# Patient Record
Sex: Female | Born: 1970 | Race: Black or African American | Hispanic: No | Marital: Married | Smoking: Never smoker
Health system: Southern US, Community
[De-identification: ages and names within clinical notes are randomized; demographics above are authoritative.]

## PROBLEM LIST (undated history)

## (undated) DIAGNOSIS — M25572 Pain in left ankle and joints of left foot: Secondary | ICD-10-CM

## (undated) DIAGNOSIS — K56609 Unspecified intestinal obstruction, unspecified as to partial versus complete obstruction: Secondary | ICD-10-CM

## (undated) DIAGNOSIS — K565 Intestinal adhesions [bands], unspecified as to partial versus complete obstruction: Secondary | ICD-10-CM

## (undated) DIAGNOSIS — G8929 Other chronic pain: Secondary | ICD-10-CM

## (undated) HISTORY — PX: ABDOMINAL HYSTERECTOMY: SHX81

## (undated) HISTORY — PX: URETHRA SURGERY: SHX824

## (undated) HISTORY — PX: HERNIA REPAIR: SHX51

---

## 2005-01-25 ENCOUNTER — Emergency Department (HOSPITAL_COMMUNITY): Admission: EM | Admit: 2005-01-25 | Discharge: 2005-01-26 | Payer: Self-pay | Admitting: Emergency Medicine

## 2006-02-06 ENCOUNTER — Other Ambulatory Visit: Admission: RE | Admit: 2006-02-06 | Discharge: 2006-02-06 | Payer: Self-pay | Admitting: Gynecology

## 2007-01-21 ENCOUNTER — Other Ambulatory Visit: Admission: RE | Admit: 2007-01-21 | Discharge: 2007-01-21 | Payer: Self-pay | Admitting: Family Medicine

## 2008-03-09 ENCOUNTER — Other Ambulatory Visit: Admission: RE | Admit: 2008-03-09 | Discharge: 2008-03-09 | Payer: Self-pay | Admitting: Family Medicine

## 2008-07-04 ENCOUNTER — Emergency Department (HOSPITAL_BASED_OUTPATIENT_CLINIC_OR_DEPARTMENT_OTHER): Admission: EM | Admit: 2008-07-04 | Discharge: 2008-07-05 | Payer: Self-pay | Admitting: Emergency Medicine

## 2008-08-24 ENCOUNTER — Ambulatory Visit (HOSPITAL_BASED_OUTPATIENT_CLINIC_OR_DEPARTMENT_OTHER): Admission: RE | Admit: 2008-08-24 | Discharge: 2008-08-24 | Payer: Self-pay | Admitting: Internal Medicine

## 2008-08-24 ENCOUNTER — Ambulatory Visit: Payer: Self-pay | Admitting: Diagnostic Radiology

## 2009-04-26 ENCOUNTER — Other Ambulatory Visit: Admission: RE | Admit: 2009-04-26 | Discharge: 2009-04-26 | Payer: Self-pay | Admitting: Family Medicine

## 2010-05-23 ENCOUNTER — Other Ambulatory Visit: Admission: RE | Admit: 2010-05-23 | Discharge: 2010-05-23 | Payer: Self-pay | Admitting: Family Medicine

## 2010-08-24 ENCOUNTER — Ambulatory Visit (HOSPITAL_COMMUNITY)
Admission: RE | Admit: 2010-08-24 | Discharge: 2010-08-26 | Payer: Self-pay | Source: Home / Self Care | Attending: Obstetrics and Gynecology | Admitting: Obstetrics and Gynecology

## 2010-08-24 ENCOUNTER — Encounter (INDEPENDENT_AMBULATORY_CARE_PROVIDER_SITE_OTHER): Payer: Self-pay | Admitting: Obstetrics and Gynecology

## 2010-08-31 ENCOUNTER — Encounter
Admission: RE | Admit: 2010-08-31 | Discharge: 2010-08-31 | Payer: Self-pay | Source: Home / Self Care | Attending: Obstetrics and Gynecology | Admitting: Obstetrics and Gynecology

## 2010-10-01 ENCOUNTER — Encounter: Payer: Self-pay | Admitting: Obstetrics and Gynecology

## 2010-11-20 LAB — BASIC METABOLIC PANEL
BUN: 13 mg/dL (ref 6–23)
CO2: 28 mEq/L (ref 19–32)
Chloride: 104 mEq/L (ref 96–112)
Creatinine, Ser: 1.03 mg/dL (ref 0.4–1.2)
Glucose, Bld: 100 mg/dL — ABNORMAL HIGH (ref 70–99)

## 2010-11-20 LAB — CBC
HCT: 30.9 % — ABNORMAL LOW (ref 36.0–46.0)
MCH: 29.3 pg (ref 26.0–34.0)
MCHC: 33 g/dL (ref 30.0–36.0)
MCV: 88.8 fL (ref 78.0–100.0)
RDW: 13.1 % (ref 11.5–15.5)

## 2010-11-21 LAB — CBC
HCT: 36.4 % (ref 36.0–46.0)
Hemoglobin: 12.4 g/dL (ref 12.0–15.0)
MCHC: 33.9 g/dL (ref 30.0–36.0)
MCV: 92.1 fL (ref 78.0–100.0)
RDW: 13.4 % (ref 11.5–15.5)

## 2011-06-12 LAB — CBC
Hemoglobin: 12.5
MCHC: 34.3
MCV: 90.7
RDW: 12.4

## 2011-06-12 LAB — COMPREHENSIVE METABOLIC PANEL
AST: 26
Albumin: 4.5
Alkaline Phosphatase: 45
BUN: 14
CO2: 23
Chloride: 103
GFR calc non Af Amer: 56 — ABNORMAL LOW
Potassium: 4.3
Total Bilirubin: 0.6

## 2011-06-12 LAB — DIFFERENTIAL
Basophils Absolute: 0.1
Basophils Relative: 1
Eosinophils Relative: 1
Monocytes Absolute: 0.7
Neutro Abs: 4.9

## 2011-06-12 LAB — URINALYSIS, ROUTINE W REFLEX MICROSCOPIC
Glucose, UA: NEGATIVE
Hgb urine dipstick: NEGATIVE
Ketones, ur: NEGATIVE
Protein, ur: NEGATIVE

## 2011-06-12 LAB — POCT CARDIAC MARKERS
CKMB, poc: <1 — ABNORMAL LOW
Myoglobin, poc: 36.8
Troponin i, poc: <0.05

## 2015-03-25 ENCOUNTER — Encounter (HOSPITAL_BASED_OUTPATIENT_CLINIC_OR_DEPARTMENT_OTHER): Payer: Self-pay | Admitting: *Deleted

## 2015-03-25 ENCOUNTER — Emergency Department (HOSPITAL_BASED_OUTPATIENT_CLINIC_OR_DEPARTMENT_OTHER): Payer: Managed Care, Other (non HMO)

## 2015-03-25 ENCOUNTER — Inpatient Hospital Stay (HOSPITAL_BASED_OUTPATIENT_CLINIC_OR_DEPARTMENT_OTHER)
Admission: EM | Admit: 2015-03-25 | Discharge: 2015-03-29 | DRG: 390 | Disposition: A | Discharge status: Home / Self Care | Payer: Managed Care, Other (non HMO) | Attending: General Surgery | Admitting: General Surgery

## 2015-03-25 DIAGNOSIS — K56609 Unspecified intestinal obstruction, unspecified as to partial versus complete obstruction: Secondary | ICD-10-CM

## 2015-03-25 DIAGNOSIS — Z886 Allergy status to analgesic agent status: Secondary | ICD-10-CM

## 2015-03-25 DIAGNOSIS — Z90710 Acquired absence of both cervix and uterus: Secondary | ICD-10-CM

## 2015-03-25 DIAGNOSIS — K5660 Unspecified intestinal obstruction: Secondary | ICD-10-CM | POA: Diagnosis not present

## 2015-03-25 DIAGNOSIS — R109 Unspecified abdominal pain: Secondary | ICD-10-CM | POA: Diagnosis not present

## 2015-03-25 HISTORY — DX: Unspecified intestinal obstruction, unspecified as to partial versus complete obstruction: K56.609

## 2015-03-25 LAB — URINALYSIS, ROUTINE W REFLEX MICROSCOPIC
Bilirubin Urine: NEGATIVE
Glucose, UA: NEGATIVE mg/dL
HGB URINE DIPSTICK: NEGATIVE
KETONES UR: 40 mg/dL — AB
LEUKOCYTES UA: NEGATIVE
Nitrite: NEGATIVE
PH: 6 (ref 5.0–8.0)
PROTEIN: NEGATIVE mg/dL
SPECIFIC GRAVITY, URINE: 1.026 (ref 1.005–1.030)
Urobilinogen, UA: 0.2 mg/dL (ref 0.0–1.0)

## 2015-03-25 LAB — COMPREHENSIVE METABOLIC PANEL
ALT: 13 U/L — ABNORMAL LOW (ref 14–54)
ANION GAP: 9 (ref 5–15)
AST: 20 U/L (ref 15–41)
Albumin: 4.4 g/dL (ref 3.5–5.0)
Alkaline Phosphatase: 55 U/L (ref 38–126)
BUN: 18 mg/dL (ref 6–20)
CALCIUM: 9.5 mg/dL (ref 8.9–10.3)
CO2: 26 mmol/L (ref 22–32)
CREATININE: 1.11 mg/dL — AB (ref 0.44–1.00)
Chloride: 102 mmol/L (ref 101–111)
GFR, EST NON AFRICAN AMERICAN: 60 mL/min — AB (ref >60)
GLUCOSE: 114 mg/dL — AB (ref 65–99)
Potassium: 3.9 mmol/L (ref 3.5–5.1)
SODIUM: 137 mmol/L (ref 135–145)
Total Bilirubin: 0.6 mg/dL (ref 0.3–1.2)
Total Protein: 8.4 g/dL — ABNORMAL HIGH (ref 6.5–8.1)

## 2015-03-25 LAB — CBC WITH DIFFERENTIAL/PLATELET
BASOS ABS: 0 10*3/uL (ref 0.0–0.1)
Basophils Relative: 0 % (ref 0–1)
EOS PCT: 0 % (ref 0–5)
Eosinophils Absolute: 0 10*3/uL (ref 0.0–0.7)
HEMATOCRIT: 39.8 % (ref 36.0–46.0)
HEMOGLOBIN: 13.4 g/dL (ref 12.0–15.0)
LYMPHS ABS: 2 10*3/uL (ref 0.7–4.0)
Lymphocytes Relative: 22 % (ref 12–46)
MCH: 29.8 pg (ref 26.0–34.0)
MCHC: 33.7 g/dL (ref 30.0–36.0)
MCV: 88.6 fL (ref 78.0–100.0)
MONOS PCT: 4 % (ref 3–12)
Monocytes Absolute: 0.3 10*3/uL (ref 0.1–1.0)
NEUTROS ABS: 6.8 10*3/uL (ref 1.7–7.7)
NEUTROS PCT: 74 % (ref 43–77)
PLATELETS: 332 10*3/uL (ref 150–400)
RBC: 4.49 MIL/uL (ref 3.87–5.11)
RDW: 12.4 % (ref 11.5–15.5)
WBC: 9.1 10*3/uL (ref 4.0–10.5)

## 2015-03-25 LAB — LIPASE, BLOOD: Lipase: 8 U/L — ABNORMAL LOW (ref 22–51)

## 2015-03-25 MED ORDER — ONDANSETRON HCL 4 MG/2ML IJ SOLN
4.0000 mg | Freq: Once | INTRAMUSCULAR | Status: AC
  Administered 2015-03-25: 4 mg via INTRAVENOUS
  Filled 2015-03-25: qty 2

## 2015-03-25 MED ORDER — MORPHINE SULFATE 4 MG/ML IJ SOLN
4.0000 mg | Freq: Once | INTRAMUSCULAR | Status: AC
  Administered 2015-03-25: 4 mg via INTRAVENOUS
  Filled 2015-03-25: qty 1

## 2015-03-25 MED ORDER — MORPHINE SULFATE 4 MG/ML IJ SOLN
4.0000 mg | Freq: Once | INTRAMUSCULAR | Status: AC
  Administered 2015-03-26: 4 mg via INTRAVENOUS
  Filled 2015-03-25: qty 1

## 2015-03-25 MED ORDER — IOHEXOL 300 MG/ML  SOLN
25.0000 mL | Freq: Once | INTRAMUSCULAR | Status: AC | PRN
  Administered 2015-03-25: 25 mL via ORAL

## 2015-03-25 MED ORDER — SODIUM CHLORIDE 0.9 % IV BOLUS (SEPSIS)
1000.0000 mL | Freq: Once | INTRAVENOUS | Status: AC
  Administered 2015-03-25: 1000 mL via INTRAVENOUS

## 2015-03-25 MED ORDER — IOHEXOL 300 MG/ML  SOLN
100.0000 mL | Freq: Once | INTRAMUSCULAR | Status: AC | PRN
  Administered 2015-03-25: 100 mL via INTRAVENOUS

## 2015-03-25 NOTE — ED Notes (Signed)
Abdominal pain. Nausea and vomiting since MN. Pelvic pain.

## 2015-03-25 NOTE — ED Provider Notes (Signed)
CSN: [id number]     Arrival date & time 03/25/15  1854 History  This chart was scribed for Geoffery Lyonsouglas Brindy Higginbotham, MD by Ronney LionSuzanne Le, ED Scribe. This patient was seen in room MH12/MH12 and the patient's care was started at 7:20 PM.    Chief Complaint  Patient presents with  . Abdominal Pain   Patient is a 44 y.o. female presenting with abdominal pain. The history is provided by the patient. No language interpreter was used.  Abdominal Pain Pain location:  Suprapubic Pain radiates to:  Does not radiate Pain severity:  Severe Duration:  20 hours Timing:  Constant Chronicity:  Recurrent Context: previous surgery (abdominal hysterectomy in 2011, followed by 3 surgeries in 2012 (see HPI comments))   Relieved by:  Nothing Worsened by:  Nothing tried Ineffective treatments:  None tried Associated symptoms: nausea and vomiting   Associated symptoms: no diarrhea and no dysuria    HPI Comments: Renee Murphy is a 44 y.o. female who presents to the Emergency Department complaining of severe suprapubic abdominal pain that began last night and woke her from sleep in the middle of the night. She states she then began to vomit and gag this morning when she woke up and has been unable to keep down food or drink since. Nobody at home has similar symptoms. Patient had similar symptoms 08/2014, during which she was [place] in [place] she states she was given an IV there and told to f/u with a PCP; she did not have surgery. Her PCP then diagnosed her with an incisional hernia in 09/2014. Patient has a history of abdominal hyste[facility] in 2011, and 3 surgeries to repair scar tissue related issues following that surgery in 2012, including a urethral re-implant surgery. She denies a history of diverticulitis. She also denies diarrhea or dysuria.   Past Medical History  Diagnosis Date  . Bowel obstruction    Past Surgical History  Procedure Laterality Date   . Abdominal hysterectomy    . Hernia repair    . Urethra surgery     No family history on file. History  Substance Use Topics  . Smoking status: Never Smoker   . Smokeless tobacco: Not on file  . Alcohol Use: No   OB History    No data available     Review of Systems  Gastrointestinal: Positive for nausea, vomiting and abdominal pain. Negative for diarrhea.  Genitourinary: Negative for dysuria.  All other systems reviewed and are negative.  Allergies  Review of patient's allergies indicates no known allergies.  Home Medications   Prior to Admission medications   Not on File   BP 134/78 mmHg  Pulse 78  Temp(Src) 98.5 F (36.9 C) (Oral)  Resp 18  Ht 5\' 9"  (1.753 m)  Wt 175 lb (79.379 kg)  BMI 25.83 kg/m2  SpO2 100% Physical Exam  Constitutional: She is oriented to person, place, and time. She appears well-developed and well-nourished. No distress.  HENT:  Head: Normocephalic and atraumatic.  Right Ear: Hearing normal.  Left Ear: Hearing normal.  Nose: Nose normal.  Mouth/Throat: Oropharynx is clear and moist and mucous membranes are normal.  Eyes: Conjunctivae and EOM are normal. Pupils are equal, round, and reactive to light.  Neck: Normal range of motion. Neck supple.  Cardiovascular: Regular rhythm, S1 normal and S2 normal.  Exam reveals no gallop and no friction rub.   No murmur heard. Pulmonary/Chest: Effort normal and breath sounds normal. No  respiratory distress. She exhibits no tenderness.  Abdominal: Soft. Normal appearance and bowel sounds are normal. There is no hepatosplenomegaly. There is tenderness. There is no rebound, no guarding, no tenderness at McBurney's point and negative Murphy's sign. No hernia.  TTP in LLQ. No rebound or guarding.  Musculoskeletal: Normal range of motion.  Neurological: She is alert and oriented to person, place, and time. She has normal strength. No cranial nerve deficit or sensory deficit. Coordination normal. GCS eye  subscore is 4. GCS verbal subscore is 5. GCS motor subscore is 6.  Skin: Skin is warm, dry and intact. No rash noted. No cyanosis.  Psychiatric: She has a normal mood and affect. Her speech is normal and behavior is normal. Thought content normal.  Nursing note and vitals reviewed.   ED Course  Procedures (including critical care time)  DIAGNOSTIC STUDIES: Oxygen Saturation is 100% on RA, normal by my interpretation.    COORDINATION OF CARE: 7:26 PM - Discussed treatment plan with pt at bedside which includes CT abdomen to r/o bowel obstruction, blood tests, and medications, and pt agreed to plan.   Labs Review Labs Reviewed  COMPREHENSIVE METABOLIC PANEL - Abnormal; Notable for the following:    Glucose, Bld 114 (*)    Creatinine, Ser 1.11 (*)    Total Protein 8.4 (*)    ALT 13 (*)    GFR calc non Af Amer 60 (*)    All other components within normal limits  LIPASE, BLOOD - Abnormal; Notable for the following:    Lipase 8 (*)    All other components within normal limits  URINALYSIS, ROUTINE W REFLEX MICROSCOPIC (NOT AT [facility]) - Abnormal; Notable for the following:    Ketones, ur 40 (*)    All other components within normal limits  CBC WITH DIFFERENTIAL/PLATELET    Imaging Review No results found.   EKG Interpretation None      MDM   Final diagnoses:  None    Patient with history of multiple abdominal surgeries. She presents here with complaints of lower abdominal discomfort that started earlier this morning. She has a history of prior small bowel obstructions and this feels the same. Laboratory studies are unremarkable, however CT scan reveals what appears to be an early sbo.  I have spoken with Dr. Carolynne Edouard from general surgery who is recommending transfer to the [facility] ER where he will evaluate the patient.   I personally performed the services described in this documentation, which was scribed in my presence. The recorded information has been reviewed and is  accurate.     Geoffery Lyons, MD 03/25/15 2202

## 2015-03-25 NOTE — ED Provider Notes (Addendum)
[facility] Some more pain since last had meds, otherwise no new complaints.  Dr Carolynne Edouardoth aware [facility].   Raeford RazorStephen Torrey Ballinas, MD 03/25/15 [phone]  Raeford RazorStephen Milika Ventress, MD 03/25/15 [phone]

## 2015-03-25 NOTE — ED Notes (Signed)
Bed: WA08 Expected date:  Expected time:  Means of arrival:  Comments: [facility]

## 2015-03-26 DIAGNOSIS — Z90710 Acquired absence of both cervix and uterus: Secondary | ICD-10-CM | POA: Diagnosis not present

## 2015-03-26 DIAGNOSIS — R109 Unspecified abdominal pain: Secondary | ICD-10-CM | POA: Diagnosis present

## 2015-03-26 DIAGNOSIS — K56609 Unspecified intestinal obstruction, unspecified as to partial versus complete obstruction: Secondary | ICD-10-CM | POA: Diagnosis present

## 2015-03-26 DIAGNOSIS — Z886 Allergy status to analgesic agent status: Secondary | ICD-10-CM | POA: Diagnosis not present

## 2015-03-26 DIAGNOSIS — K5660 Unspecified intestinal obstruction: Secondary | ICD-10-CM | POA: Diagnosis present

## 2015-03-26 LAB — CBC
HCT: 34.2 % — ABNORMAL LOW (ref 36.0–46.0)
Hemoglobin: 11.1 g/dL — ABNORMAL LOW (ref 12.0–15.0)
MCH: 29.4 pg (ref 26.0–34.0)
MCHC: 32.5 g/dL (ref 30.0–36.0)
MCV: 90.7 fL (ref 78.0–100.0)
PLATELETS: 303 10*3/uL (ref 150–400)
RBC: 3.77 MIL/uL — ABNORMAL LOW (ref 3.87–5.11)
RDW: 13 % (ref 11.5–15.5)
WBC: 6.7 10*3/uL (ref 4.0–10.5)

## 2015-03-26 LAB — BASIC METABOLIC PANEL
ANION GAP: 4 — AB (ref 5–15)
BUN: 14 mg/dL (ref 6–20)
CALCIUM: 8.5 mg/dL — AB (ref 8.9–10.3)
CO2: 28 mmol/L (ref 22–32)
Chloride: 106 mmol/L (ref 101–111)
Creatinine, Ser: 1.14 mg/dL — ABNORMAL HIGH (ref 0.44–1.00)
GFR calc Af Amer: >60 mL/min (ref >60)
GFR calc non Af Amer: 58 mL/min — ABNORMAL LOW (ref >60)
Glucose, Bld: 133 mg/dL — ABNORMAL HIGH (ref 65–99)
Potassium: 3.7 mmol/L (ref 3.5–5.1)
Sodium: 138 mmol/L (ref 135–145)

## 2015-03-26 MED ORDER — CHLORHEXIDINE GLUCONATE 0.12 % MT SOLN
15.0000 mL | Freq: Two times a day (BID) | OROMUCOSAL | Status: DC
  Administered 2015-03-26 – 2015-03-28 (×5): 15 mL via OROMUCOSAL
  Filled 2015-03-26 (×9): qty 15

## 2015-03-26 MED ORDER — CETYLPYRIDINIUM CHLORIDE 0.05 % MT LIQD
7.0000 mL | Freq: Two times a day (BID) | OROMUCOSAL | Status: DC
  Administered 2015-03-26 (×2): 7 mL via OROMUCOSAL

## 2015-03-26 MED ORDER — ONDANSETRON HCL 4 MG/2ML IJ SOLN: 4.0000 mg | Freq: Four times a day (QID) | INTRAMUSCULAR | Status: DC | PRN

## 2015-03-26 MED ORDER — KCL IN DEXTROSE-NACL 20-5-0.9 MEQ/L-%-% IV SOLN
INTRAVENOUS | Status: DC
  Administered 2015-03-26: 100 mL via INTRAVENOUS
  Administered 2015-03-26 – 2015-03-28 (×6): via INTRAVENOUS
  Filled 2015-03-26 (×10): qty 1000

## 2015-03-26 MED ORDER — PANTOPRAZOLE SODIUM 40 MG IV SOLR
40.0000 mg | Freq: Every day | INTRAVENOUS | Status: DC
  Administered 2015-03-26 – 2015-03-29 (×4): 40 mg via INTRAVENOUS
  Filled 2015-03-26 (×5): qty 40

## 2015-03-26 MED ORDER — MORPHINE SULFATE 2 MG/ML IJ SOLN
1.0000 mg | INTRAMUSCULAR | Status: DC | PRN
  Administered 2015-03-26: 2 mg via INTRAVENOUS
  Filled 2015-03-26: qty 1

## 2015-03-26 NOTE — Progress Notes (Signed)
Subjective: Feeling better. Nausea has resolved. No flatus yet  Objective: Vital signs in last 24 hours: Temp:  [97.8 F (36.6 C)-98.5 F (36.9 C)] 98 F (36.7 C) (07/16 0530) Pulse Rate:  [62-78] 62 (07/16 0530) Resp:  [16-18] 16 (07/16 0530) BP: (116-143)/(63-94) 116/63 mmHg (07/16 0530) SpO2:  [97 %-100 %] 100 % (07/16 0530) Weight:  [79.1 kg (174 lb 6.1 oz)-79.379 kg (175 lb)] 79.1 kg (174 lb 6.1 oz) (07/16 0115) Last BM Date: 03/25/15  Intake/Output from previous day: 07/15 0701 - 07/16 0700 In: -  Out: 500 [Urine:500] Intake/Output this shift:    Resp: clear to auscultation bilaterally Cardio: regular rate and rhythm GI: soft, mild tenderness. good bs. no distension  Lab Results:   Recent Labs  03/25/15 1925 03/26/15 0532  WBC 9.1 6.7  HGB 13.4 11.1*  HCT 39.8 34.2*  PLT 332 303   BMET  Recent Labs  03/25/15 1925 03/26/15 0532  NA 137 138  K 3.9 3.7  CL 102 106  CO2 26 28  GLUCOSE 114* 133*  BUN 18 14  CREATININE 1.11* 1.14*  CALCIUM 9.5 8.5*   PT/INR No results for input(s): LABPROT, INR in the last 72 hours. ABG No results for input(s): PHART, HCO3 in the last 72 hours.  Invalid input(s): PCO2, PO2  Studies/Results: Ct Abdomen Pelvis W Contrast  03/25/2015   CLINICAL DATA:  Severe abdominal pain starting last night, vomiting, history of bowel obstruction  EXAM: CT ABDOMEN AND PELVIS WITH CONTRAST  TECHNIQUE: Multidetector CT imaging of the abdomen and pelvis was performed using the standard protocol following bolus administration of intravenous contrast.  CONTRAST:  25mL OMNIPAQUE IOHEXOL 300 MG/ML SOLN, 100mL OMNIPAQUE IOHEXOL 300 MG/ML SOLN  COMPARISON:  08/31/2010.  FINDINGS: Sagittal images of the spine are unremarkable.  The lung bases are unremarkable. Enhanced liver shows stable scattered hepatic cysts. No calcified gallstones are noted within gallbladder.  The pancreas, spleen and adrenal glands are unremarkable. Kidneys are  symmetrical in size and enhancement. Some lobulated contour of the left kidney again noted. No hydronephrosis or hydroureter.  Delayed renal images shows bilateral renal symmetrical excretion. Bilateral visualized proximal ureter is unremarkable. Stable bilateral mild dilatation of extrarenal pelvis.  Oral contrast material was given to the patient. No any contrast material is noted within distal small bowel or within colon.  There is some liquid stool in right colon and transverse colon. Post hernia repair changes noted mid anterior abdominal wall.  A normal appendix is noted in axial image 55.  In axial image 63 there is a distended small bowel loop in right lower pelvis anteriorly suprapubic region. Mild thickening of small bowel wall. There is transition point in caliber of small bowel at this level in axial image 62. Some pulling contrast material is noted at this level. Findings are highly suspicious for small bowel obstruction. Beyond this point the small is smaller caliber. This is confirmed in coronal image 36.  The patient is status post hysterectomy. Small amount of free fluid noted left posterior pelvis. Nonspecific mild thickening of urinary bladder bladder wall which is under distended.  IMPRESSION: 1. In axial image 63 there is a distended small bowel loop in right lower pelvis anteriorly suprapubic region. Mild thickening of small bowel wall. There is transition point in caliber of small bowel at this level in axial image 62. Some pulling contrast material is noted at this level. Findings are highly suspicious for small bowel obstruction. 2. No hydronephrosis or hydroureter. 3.  Normal appendix.  No pericecal inflammation. 4. Status post hysterectomy. 5. Small amount of free fluid noted in left posterior pelvis.  These results were called by telephone at the time of interpretation on 03/25/2015 at 9:26 pm to Dr. Geoffery Lyons , who verbally acknowledged these results.   Electronically Signed   By: Natasha Mead M.D.   On: 03/25/2015 21:27    Anti-infectives: Anti-infectives    None      Assessment/Plan: s/p * No surgery found * continue bowel rest for sbo  Recheck abd xrays tomorrow ambulate  LOS: 0 days    TOTH III,PAUL S 03/26/2015

## 2015-03-26 NOTE — Plan of Care (Signed)
Problem: Phase I Progression Outcomes Goal: Pain controlled with appropriate interventions Outcome: Progressing Good control of abd pain, but pt c/o persistent HA on forehead and L temple.  Noted pt keeping head sloped forward off of pillow and encouraged her to keep good head/neck support.  Pt using ice/heat and Dilaudid to bring HA from 7/10 to 4/10.

## 2015-03-26 NOTE — Progress Notes (Signed)
UR Completed. Chrishawn Kring, RN, BSN.  [phone] 

## 2015-03-26 NOTE — H&P (Signed)
Renee Murphy is an 44 y.o. female.   Chief Complaint: abdominal pain HPI: The pt is a 44 yo bf with a history of multiple abdominal surgeries and lots of scar tissue who presents with abdominal pain that started last night. Some nausea and vomiting. No fever. CT shows sbo  Past Medical History  Diagnosis Date  . Bowel obstruction     Past Surgical History  Procedure Laterality Date  . Abdominal hysterectomy    . Hernia repair    . Urethra surgery      No family history on file. Social History:  reports that she has never smoked. She does not have any smokeless tobacco history on file. She reports that she does not drink alcohol or use illicit drugs.  Allergies:  Allergies  Allergen Reactions  . Aspirin Nausea And Vomiting     (Not in a hospital admission)  Results for orders placed or performed during the hospital encounter of 03/25/15 (from the past 48 hour(s))  Comprehensive metabolic panel     Status: Abnormal   Collection Time: 03/25/15  7:25 PM  Result Value Ref Range   Sodium 137 135 - 145 mmol/L   Potassium 3.9 3.5 - 5.1 mmol/L   Chloride 102 101 - 111 mmol/L   CO2 26 22 - 32 mmol/L   Glucose, Bld 114 (H) 65 - 99 mg/dL   BUN 18 6 - 20 mg/dL   Creatinine, Ser 1.11 (H) 0.44 - 1.00 mg/dL   Calcium 9.5 8.9 - 10.3 mg/dL   Total Protein 8.4 (H) 6.5 - 8.1 g/dL   Albumin 4.4 3.5 - 5.0 g/dL   AST 20 15 - 41 U/L   ALT 13 (L) 14 - 54 U/L   Alkaline Phosphatase 55 38 - 126 U/L   Total Bilirubin 0.6 0.3 - 1.2 mg/dL   GFR calc non Af Amer 60 (L) >60 mL/min   GFR calc Af Amer >60 >60 mL/min    Comment: (NOTE) The eGFR has been calculated using the CKD EPI equation. This calculation has not been validated in all clinical situations. eGFR's persistently <60 mL/min signify possible Chronic Kidney Disease.    Anion gap 9 5 - 15  CBC with Differential     Status: None   Collection Time: 03/25/15  7:25 PM  Result Value Ref Range   WBC 9.1 4.0 - 10.5 K/uL   RBC 4.49  3.87 - 5.11 MIL/uL   Hemoglobin 13.4 12.0 - 15.0 g/dL   HCT 39.8 36.0 - 46.0 %   MCV 88.6 78.0 - 100.0 fL   MCH 29.8 26.0 - 34.0 pg   MCHC 33.7 30.0 - 36.0 g/dL   RDW 12.4 11.5 - 15.5 %   Platelets 332 150 - 400 K/uL   Neutrophils Relative % 74 43 - 77 %   Neutro Abs 6.8 1.7 - 7.7 K/uL   Lymphocytes Relative 22 12 - 46 %   Lymphs Abs 2.0 0.7 - 4.0 K/uL   Monocytes Relative 4 3 - 12 %   Monocytes Absolute 0.3 0.1 - 1.0 K/uL   Eosinophils Relative 0 0 - 5 %   Eosinophils Absolute 0.0 0.0 - 0.7 K/uL   Basophils Relative 0 0 - 1 %   Basophils Absolute 0.0 0.0 - 0.1 K/uL  Lipase, blood     Status: Abnormal   Collection Time: 03/25/15  7:25 PM  Result Value Ref Range   Lipase 8 (L) 22 - 51 U/L  Urinalysis, Routine w reflex  microscopic (not at [facility])     Status: Abnormal   Collection Time: 03/25/15  7:25 PM  Result Value Ref Range   Color, Urine YELLOW YELLOW   APPearance CLEAR CLEAR   Specific Gravity, Urine 1.026 1.005 - 1.030   pH 6.0 5.0 - 8.0   Glucose, UA NEGATIVE NEGATIVE mg/dL   Hgb urine dipstick NEGATIVE NEGATIVE   Bilirubin Urine NEGATIVE NEGATIVE   Ketones, ur 40 (A) NEGATIVE mg/dL   Protein, ur NEGATIVE NEGATIVE mg/dL   Urobilinogen, UA 0.2 0.0 - 1.0 mg/dL   Nitrite NEGATIVE NEGATIVE   Leukocytes, UA NEGATIVE NEGATIVE    Comment: MICROSCOPIC NOT DONE ON URINES WITH NEGATIVE PROTEIN, BLOOD, LEUKOCYTES, NITRITE, OR GLUCOSE <1000 mg/dL.   Ct Abdomen Pelvis W Contrast  03/25/2015   CLINICAL DATA:  Severe abdominal pain starting last night, vomiting, history of bowel obstruction  EXAM: CT ABDOMEN AND PELVIS WITH CONTRAST  TECHNIQUE: Multidetector CT imaging of the abdomen and pelvis was performed using the standard protocol following bolus administration of intravenous contrast.  CONTRAST:  77m OMNIPAQUE IOHEXOL 300 MG/ML SOLN, 1042mOMNIPAQUE IOHEXOL 300 MG/ML SOLN  COMPARISON:  08/31/2010.  FINDINGS: Sagittal images of the spine are unremarkable.  The lung bases are  unremarkable. Enhanced liver shows stable scattered hepatic cysts. No calcified gallstones are noted within gallbladder.  The pancreas, spleen and adrenal glands are unremarkable. Kidneys are symmetrical in size and enhancement. Some lobulated contour of the left kidney again noted. No hydronephrosis or hydroureter.  Delayed renal images shows bilateral renal symmetrical excretion. Bilateral visualized proximal ureter is unremarkable. Stable bilateral mild dilatation of extrarenal pelvis.  Oral contrast material was given to the patient. No any contrast material is noted within distal small bowel or within colon.  There is some liquid stool in right colon and transverse colon. Post hernia repair changes noted mid anterior abdominal wall.  A normal appendix is noted in axial image 55.  In axial image 63 there is a distended small bowel loop in right lower pelvis anteriorly suprapubic region. Mild thickening of small bowel wall. There is transition point in caliber of small bowel at this level in axial image 62. Some pulling contrast material is noted at this level. Findings are highly suspicious for small bowel obstruction. Beyond this point the small is smaller caliber. This is confirmed in coronal image 36.  The patient is status post hysterectomy. Small amount of free fluid noted left posterior pelvis. Nonspecific mild thickening of urinary bladder bladder wall which is under distended.  IMPRESSION: 1. In axial image 63 there is a distended small bowel loop in right lower pelvis anteriorly suprapubic region. Mild thickening of small bowel wall. There is transition point in caliber of small bowel at this level in axial image 62. Some pulling contrast material is noted at this level. Findings are highly suspicious for small bowel obstruction. 2. No hydronephrosis or hydroureter. 3. Normal appendix.  No pericecal inflammation. 4. Status post hysterectomy. 5. Small amount of free fluid noted in left posterior pelvis.   These results were called by telephone at the time of interpretation on 03/25/2015 at 9:26 pm to Dr. DOVeryl Speak who verbally acknowledged these results.   Electronically Signed   By: LiLahoma Crocker.D.   On: 03/25/2015 21:27    Review of Systems  Constitutional: Negative.   HENT: Negative.   Eyes: Negative.   Respiratory: Negative.   Cardiovascular: Negative.   Gastrointestinal: Positive for nausea, vomiting and abdominal pain.  Genitourinary: Negative.   Musculoskeletal: Negative.   Skin: Negative.   Neurological: Negative.   Endo/Heme/Allergies: Negative.   Psychiatric/Behavioral: Negative.     Blood pressure 143/94, pulse 67, temperature 98.5 F (36.9 C), temperature source Oral, resp. rate 18, height _0  (1.753 m), weight 79.379 kg (175 lb), SpO2 100 %. Physical Exam  Constitutional: She is oriented to person, place, and time. She appears well-developed and well-nourished.  HENT:  Head: Normocephalic and atraumatic.  Eyes: Conjunctivae and EOM are normal. Pupils are equal, round, and reactive to light.  Neck: Normal range of motion. Neck supple.  Cardiovascular: Normal rate, regular rhythm and normal heart sounds.   Respiratory: Effort normal and breath sounds normal.  GI: Soft. Bowel sounds are normal. She exhibits no distension.  There is mild tenderness over lower abdomen, worse on left. No guarding or peritonitis.  Musculoskeletal: Normal range of motion.  Neurological: She is alert and oriented to person, place, and time.  Skin: Skin is warm and dry.  Psychiatric: She has a normal mood and affect. Her behavior is normal.     Assessment/Plan The pt appears to have a sbo. I will plan to admit for bowel rest and close observation.  TOTH III,Zaiyden Strozier S 03/26/2015, 12:10 AM

## 2015-03-27 ENCOUNTER — Inpatient Hospital Stay (HOSPITAL_COMMUNITY): Payer: Managed Care, Other (non HMO)

## 2015-03-27 NOTE — Progress Notes (Signed)
Patient ID: Renee Murphy, female   DOB: 02-Mar-1971, 44 y.o.   MRN: [id number]    Subjective: Had some flatus yesterday but no bowel movements. No bowel activity today. Denies nausea or vomiting. Some intermittent mid abdominal pain which is unchanged.  Objective: Vital signs in last 24 hours: Temp:  [98 F (36.7 C)-99 F (37.2 C)] 98.4 F (36.9 C) (07/17 0629) Pulse Rate:  [68-74] 68 (07/17 0629) Resp:  [16-18] 18 (07/17 0629) BP: (96-141)/(56-87) 96/56 mmHg (07/17 0629) SpO2:  [100 %] 100 % (07/17 0629) Last BM Date: 03/25/15  Intake/Output from previous day: 07/16 0701 - 07/17 0700 In: 2848.3 [I.V.:2848.3] Out: 0  Intake/Output this shift:    General appearance: alert, cooperative and no distress GI: minimal if any distention. Soft with very mild diffuse tenderness, no guarding. She states she is less tender than yesterday.  Lab Results:   Recent Labs  03/25/15 1925 03/26/15 0532  WBC 9.1 6.7  HGB 13.4 11.1*  HCT 39.8 34.2*  PLT 332 303   BMET  Recent Labs  03/25/15 1925 03/26/15 0532  NA 137 138  K 3.9 3.7  CL 102 106  CO2 26 28  GLUCOSE 114* 133*  BUN 18 14  CREATININE 1.11* 1.14*  CALCIUM 9.5 8.5*     Studies/Results: Ct Abdomen Pelvis W Contrast  03/25/2015   CLINICAL DATA:  Severe abdominal pain starting last night, vomiting, history of bowel obstruction  EXAM: CT ABDOMEN AND PELVIS WITH CONTRAST  TECHNIQUE: Multidetector CT imaging of the abdomen and pelvis was performed using the standard protocol following bolus administration of intravenous contrast.  CONTRAST:  25mL OMNIPAQUE IOHEXOL 300 MG/ML SOLN, 100mL OMNIPAQUE IOHEXOL 300 MG/ML SOLN  COMPARISON:  08/31/2010.  FINDINGS: Sagittal images of the spine are unremarkable.  The lung bases are unremarkable. Enhanced liver shows stable scattered hepatic cysts. No calcified gallstones are noted within gallbladder.  The pancreas, spleen and adrenal glands are unremarkable. Kidneys are symmetrical in  size and enhancement. Some lobulated contour of the left kidney again noted. No hydronephrosis or hydroureter.  Delayed renal images shows bilateral renal symmetrical excretion. Bilateral visualized proximal ureter is unremarkable. Stable bilateral mild dilatation of extrarenal pelvis.  Oral contrast material was given to the patient. No any contrast material is noted within distal small bowel or within colon.  There is some liquid stool in right colon and transverse colon. Post hernia repair changes noted mid anterior abdominal wall.  A normal appendix is noted in axial image 55.  In axial image 63 there is a distended small bowel loop in right lower pelvis anteriorly suprapubic region. Mild thickening of small bowel wall. There is transition point in caliber of small bowel at this level in axial image 62. Some pulling contrast material is noted at this level. Findings are highly suspicious for small bowel obstruction. Beyond this point the small is smaller caliber. This is confirmed in coronal image 36.  The patient is status post hysterectomy. Small amount of free fluid noted left posterior pelvis. Nonspecific mild thickening of urinary bladder bladder wall which is under distended.  IMPRESSION: 1. In axial image 63 there is a distended small bowel loop in right lower pelvis anteriorly suprapubic region. Mild thickening of small bowel wall. There is transition point in caliber of small bowel at this level in axial image 62. Some pulling contrast material is noted at this level. Findings are highly suspicious for small bowel obstruction. 2. No hydronephrosis or hydroureter. 3. Normal appendix.  No  pericecal inflammation. 4. Status post hysterectomy. 5. Small amount of free fluid noted in left posterior pelvis.  These results were called by telephone at the time of interpretation on 03/25/2015 at 9:26 pm to Dr. Geoffery Lyons , who verbally acknowledged these results.   Electronically Signed   By: Natasha Mead M.D.    On: 03/25/2015 21:27    Anti-infectives: Anti-infectives    None      Assessment/Plan: Partial small bowel obstruction. History of multiple abdominal procedures, ventral hernia repair with mesh and has been told she has severe adhesions. Clinically seems about the same or slightly improved. X-rays this morning pending. Continue current management with bowel rest and IV fluids.    LOS: 1 day    Xyla Leisner T 03/27/2015

## 2015-03-28 ENCOUNTER — Inpatient Hospital Stay (HOSPITAL_COMMUNITY): Payer: Managed Care, Other (non HMO)

## 2015-03-28 LAB — BASIC METABOLIC PANEL
ANION GAP: 5 (ref 5–15)
BUN: 7 mg/dL (ref 6–20)
CO2: 26 mmol/L (ref 22–32)
Calcium: 8.9 mg/dL (ref 8.9–10.3)
Chloride: 107 mmol/L (ref 101–111)
Creatinine, Ser: 1.16 mg/dL — ABNORMAL HIGH (ref 0.44–1.00)
GFR calc Af Amer: >60 mL/min (ref >60)
GFR calc non Af Amer: 57 mL/min — ABNORMAL LOW (ref >60)
GLUCOSE: 121 mg/dL — AB (ref 65–99)
Potassium: 3.8 mmol/L (ref 3.5–5.1)
SODIUM: 138 mmol/L (ref 135–145)

## 2015-03-28 LAB — CBC
HCT: 36.2 % (ref 36.0–46.0)
HEMOGLOBIN: 11.9 g/dL — AB (ref 12.0–15.0)
MCH: 29.8 pg (ref 26.0–34.0)
MCHC: 32.9 g/dL (ref 30.0–36.0)
MCV: 90.5 fL (ref 78.0–100.0)
PLATELETS: 309 10*3/uL (ref 150–400)
RBC: 4 MIL/uL (ref 3.87–5.11)
RDW: 12.7 % (ref 11.5–15.5)
WBC: 5.8 10*3/uL (ref 4.0–10.5)

## 2015-03-28 MED ORDER — ENOXAPARIN SODIUM 40 MG/0.4ML ~~LOC~~ SOLN
40.0000 mg | SUBCUTANEOUS | Status: DC
  Administered 2015-03-28: 40 mg via SUBCUTANEOUS
  Filled 2015-03-28 (×2): qty 0.4

## 2015-03-28 NOTE — Progress Notes (Signed)
Patient requests that MD be paged to ask for discharge as patient daughter in labor with first grandchild.  Dr Abbey Chattersosenbower paged, discussed patient status and diet.  At this time not medically ready for discharge.  Alerted patient to conversation and discussed AMA.  Patient states she will stay unless something changes with daughter

## 2015-03-28 NOTE — Progress Notes (Signed)
[facility] Progress Note     Subjective: Pt feels much better.  Not much pain, no N/V.  Ambulating some.  Some flatus, no BM yet.  Thirsty/hungry.  Objective: Vital signs in last 24 hours: Temp:  [97.9 F (36.6 C)-98.5 F (36.9 C)] 97.9 F (36.6 C) 19-Apr-2023 0634) Pulse Rate:  [61-83] 61 April 19, 2023 0634) Resp:  [18] 18 04/19/23 0634) BP: (111-127)/(67-95) 124/69 mmHg 2023/04/19 0634) SpO2:  [100 %] 100 % 04-19-2023 0634) Last BM Date: 03/25/15  Intake/Output from previous day: 07/17 0701 - 2023-04-19 0700 In: 2370 [I.V.:2370] Out: 1500 [Urine:1500] Intake/Output this shift:    PE: Gen:  Alert, NAD, pleasant Abd: Soft, NT/ND, +BS, no HSM, abdominal scars noted   Lab Results:   Recent Labs  03/26/15 0532 April 19, 2015 0450  WBC 6.7 5.8  HGB 11.1* 11.9*  HCT 34.2* 36.2  PLT 303 309   BMET  Recent Labs  03/26/15 0532 04/19/15 0450  NA 138 138  K 3.7 3.8  CL 106 107  CO2 28 26  GLUCOSE 133* 121*  BUN 14 7  CREATININE 1.14* 1.16*  CALCIUM 8.5* 8.9   PT/INR No results for input(s): LABPROT, INR in the last 72 hours. CMP     Component Value Date/Time   NA 138 19-Apr-2015 0450   K 3.8 2015/04/19 0450   CL 107 April 19, 2015 0450   CO2 26 04-19-2015 0450   GLUCOSE 121* 04-19-2015 0450   BUN 7 2015-04-19 0450   CREATININE 1.16* 04-19-2015 0450   CALCIUM 8.9 04/19/2015 0450   PROT 8.4* 03/25/2015 1925   ALBUMIN 4.4 03/25/2015 1925   AST 20 03/25/2015 1925   ALT 13* 03/25/2015 1925   ALKPHOS 55 03/25/2015 1925   BILITOT 0.6 03/25/2015 1925   GFRNONAA 57* 2015/04/19 0450   GFRAA >60 Apr 19, 2015 0450   Lipase     Component Value Date/Time   LIPASE 8* 03/25/2015 1925       Studies/Results: Dg Abd 2 Views  04/19/2015   CLINICAL DATA:  Intermittent mid abdominal pain, no nausea or vomiting, follow-up small bowel obstruction.  EXAM: ABDOMEN - 2 VIEW  COMPARISON:  Abdominal films of March 27, 2015  FINDINGS: There remains contrast and gas within normal caliber colon.  Minimal small bowel gas is demonstrated. There are no free extraluminal gas collections. There is some gas in the rectum. The soft tissues exhibit no abnormal calcifications. There surgical clips in the pelvis.  IMPRESSION: There is no bowel obstruction. There remains a moderate amount of contrast and gas within the ascending transverse and proximal descending portions of the colon.   Electronically Signed   By: David  [place] M.D.   On: 04-19-15 07:25   Dg Abd 2 Views  03/27/2015   CLINICAL DATA:  Nausea, vomiting and decreased appetite  EXAM: ABDOMEN - 2 VIEW  COMPARISON:  03/25/2015  FINDINGS: Enteric contrast material from recent CT of the abdomen pelvis is identified within the colon. No dilated loops of bowel identified. No air-fluid levels noted.  IMPRESSION: 1. Nonobstructive bowel gas pattern.   Electronically Signed   By: Signa Kell M.D.   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 -X-rays show resolution of bowel obstruction -Start clears, slowly advance as tolerated over the next few days -Ambulate and IS -SCD's and start  lovenox    LOS: 2 days    Nonie HoyerMegan N Gaynell Eggleton 03/28/2015, 9:25 AM Pager: [phone]

## 2015-03-29 NOTE — Discharge Summary (Signed)
  [facility] Discharge Summary   Patient ID: Renee Murphy MRN: [id number] DOB/AGE: 43-Jan-1972 43 y.o.  Admit date: 03/25/2015 Discharge date: 03/29/2015  Admitting Diagnosis: SBO  Discharge Diagnosis Patient Active Problem List   Diagnosis Date Noted  . SBO (small bowel obstruction) 03/26/2015    Consultants None  Imaging: Dg Abd 2 Views  03/28/2015   CLINICAL DATA:  Intermittent mid abdominal pain, no nausea or vomiting, follow-up small bowel obstruction.  EXAM: ABDOMEN - 2 VIEW  COMPARISON:  Abdominal films of March 27, 2015  FINDINGS: There remains contrast and gas within normal caliber colon. Minimal small bowel gas is demonstrated. There are no free extraluminal gas collections. There is some gas in the rectum. The soft tissues exhibit no abnormal calcifications. There surgical clips in the pelvis.  IMPRESSION: There is no bowel obstruction. There remains a moderate amount of contrast and gas within the ascending transverse and proximal descending portions of the colon.   Electronically Signed   By: David  [place] M.D.   On: 03/28/2015 07:25    Procedures None  Hospital Course:  44 yo AA female [facility] with a history of multiple abdominal surgeries and lots of scar tissue who presents with abdominal pain that started last night. Some nausea and vomiting. No fever. CT shows SBO.  Patient was admitted and treated conservatively with bowel rest and NPO.  Did not require an NG tube.  Bowel function soon resumed and SBO resolved on Xray.  Diet was advanced as tolerated.  On HD #5, the patient was voiding well, tolerating diet, ambulating well, pain well controlled, vital signs stable, and felt stable for discharge home.  Patient will follow up in our office as needed and knows to call with questions or concerns.  She will follow up with her PCP for a post-hospital follow up.  Recommended low residual soft/pureed foods for 1-2 weeks and recommended bowel  regimen.   Physical Exam: General:  Alert, NAD, pleasant, comfortable Abd:  Soft, ND, NT, no HSM    Medication List    Notice    You have not been prescribed any medications.       Follow-up Information    Schedule an appointment as soon as possible for a visit with Angelica ChessmanAGUIAR,RAFAELA M., MD.   Specialty:  Family Medicine   Why:  For post-hospital follow up   Contact information:   [address] [place] [id number] [phone]       Call [facility] OFFICE [place].   Why:  As needed   Contact information:   [address] [place] [id number] [phone]      Signed: Nonie HoyerMegan N. Kimbella Heisler, [facility] [phone]  03/29/2015, 10:22 AM

## 2015-03-29 NOTE — Progress Notes (Signed)
Patient is alert and oriented, vital signs are stable, patient with flatus and having bowel movements, patient tolerating diet without complaints of nausea or vomiting, discharge instructions reviewed with patient, patient to follow up with [facility] and her primary MD Renee Murphy, Dickie Labarre N RN 7:57 AM 03-29-2015

## 2015-03-29 NOTE — Progress Notes (Signed)
Notifying MD on call regarding patients desire to be d/c this am. She had apple sauce this morning and has had a bowel movement this morning already.  Renee Murphy

## 2015-03-29 NOTE — Discharge Instructions (Signed)
Low-Fiber Diet (FOR 1-2 WEEKS) Fiber is found in fruits, vegetables, and whole grains. A low-fiber diet restricts fibrous foods that are not digested in the small intestine. A diet containing about 10-15 grams of fiber per day is considered low fiber. Low-fiber diets may be used to:  Promote healing and rest the bowel during intestinal flare-ups.  Prevent blockage of a partially obstructed or narrowed gastrointestinal tract.  Reduce fecal weight and volume.  Slow the movement of feces. You may be on a low-fiber diet as a transitional diet following surgery, after an injury (trauma), or because of a short (acute) or lifelong (chronic) illness. Your health care provider will determine the length of time you need to stay on this diet.  WHAT DO I NEED TO KNOW ABOUT A LOW-FIBER DIET? Always check the fiber content on the packaging's Nutrition Facts label, especially on foods from the grains list. Ask your dietitian if you have questions about specific foods that are related to your condition, especially if the food is not listed below. In general, a low-fiber food will have less than 2 g of fiber. WHAT FOODS CAN I EAT? Grains All breads and crackers made with white flour. Sweet rolls, doughnuts, waffles, pancakes, [place] toast, bagels. Pretzels, Melba toast, zwieback. Well-cooked cereals, such as cornmeal, farina, or cream cereals. Dry cereals that do not contain whole grains, fruit, or nuts, such as refined corn, wheat, rice, and oat cereals. Potatoes prepared any way without skins, plain pastas and noodles, refined white rice. Use white flour for baking and making sauces. Use allowed list of grains for casseroles, dumplings, and puddings.  Vegetables Strained tomato and vegetable juices. Fresh lettuce, cucumber, spinach. Well-cooked (no skin or pulp) or canned vegetables, such as asparagus, bean sprouts, beets, carrots, green beans, mushrooms, potatoes, pumpkin, spinach, yellow squash, tomato  sauce/puree, turnips, yams, and zucchini. Keep servings limited to  cup.  Fruits All fruit juices except prune juice. Cooked or canned fruits without skin and seeds, such as applesauce, apricots, cherries, fruit cocktail, grapefruit, grapes, mandarin oranges, melons, peaches, pears, pineapple, and plums. Fresh fruits without skin, such as apricots, avocados, bananas, melons, pineapple, nectarines, and peaches. Keep servings limited to  cup or 1 piece.  Meat and Other Protein Sources Ground or well-cooked tender beef, ham, veal, lamb, pork, or poultry. Eggs, plain cheese. Fish, oysters, shrimp, lobster, and other seafood. Liver, organ meats. Smooth nut butters. Dairy All milk products and alternative dairy substitutes, such as soy, rice, almond, and coconut, not containing added whole nuts, seeds, or added fruit. Beverages Decaf coffee, fruit, and vegetable juices or smoothies (small amounts, with no pulp or skins, and with fruits from allowed list), sports drinks, herbal tea. Condiments Ketchup, mustard, vinegar, cream sauce, cheese sauce, cocoa powder. Spices in moderation, such as allspice, basil, bay leaves, celery powder or leaves, cinnamon, cumin powder, curry powder, ginger, mace, marjoram, onion or garlic powder, oregano, paprika, parsley flakes, ground pepper, rosemary, sage, savory, tarragon, thyme, and turmeric. Sweets and Desserts Plain cakes and cookies, pie made with allowed fruit, pudding, custard, cream pie. Gelatin, fruit, ice, sherbet, frozen ice pops. Ice cream, ice milk without nuts. Plain hard candy, honey, jelly, molasses, syrup, sugar, chocolate syrup, gumdrops, marshmallows. Limit overall sugar intake.  Fats and Oil Margarine, butter, cream, mayonnaise, salad oils, plain salad dressings made from allowed foods. Choose healthy fats such as olive oil, canola oil, and omega-3 fatty acids (such as found in salmon or tuna) when possible.  Other Bouillon, broth, or  cream soups  made from allowed foods. Any strained soup. Casseroles or mixed dishes made with allowed foods. The items listed above may not be a complete list of recommended foods or beverages. Contact your dietitian for more options.  WHAT FOODS ARE NOT RECOMMENDED? Grains All whole wheat and whole grain breads and crackers. Multigrains, rye, bran seeds, nuts, or coconut. Cereals containing whole grains, multigrains, bran, coconut, nuts, raisins. Cooked or dry oatmeal, steel-cut oats. Coarse wheat cereals, granola. Cereals advertised as high fiber. Potato skins. Whole grain pasta, wild or brown rice. Popcorn. Coconut flour. Bran, buckwheat, corn bread, multigrains, rye, wheat germ.  Vegetables Fresh, cooked or canned vegetables, such as artichokes, asparagus, beet greens, broccoli, Brussels sprouts, cabbage, celery, cauliflower, corn, eggplant, kale, legumes or beans, okra, peas, and tomatoes. Avoid large servings of any vegetables, especially raw vegetables.  Fruits Fresh fruits, such as apples with or without skin, berries, cherries, figs, grapes, grapefruit, guavas, kiwis, mangoes, oranges, papayas, pears, persimmons, pineapple, and pomegranate. Prune juice and juices with pulp, stewed or dried prunes. Dried fruits, dates, raisins. Fruit seeds or skins. Avoid large servings of all fresh fruits. Meats and Other Protein Sources Tough, fibrous meats with gristle. Chunky nut butter. Cheese made with seeds, nuts, or other foods not recommended. Nuts, seeds, legumes (beans, including baked beans), dried peas, beans, lentils.  Dairy Yogurt or cheese that contains nuts, seeds, or added fruit.  Beverages Fruit juices with high pulp, prune juice. Caffeinated coffee and teas.  Condiments Coconut, maple syrup, pickles, olives. Sweets and Desserts Desserts, cookies, or candies that contain nuts or coconut, chunky peanut butter, dried fruits. Jams, preserves with seeds, marmalade. Large amounts of sugar and sweets. Any  other dessert made with fruits from the not recommended list.  Other Soups made from vegetables that are not recommended or that contain other foods not recommended.  The items listed above may not be a complete list of foods and beverages to avoid. Contact your dietitian for more information. Document Released: 02/16/2002 Document Revised: 09/01/2013 Document Reviewed: 07/20/2013 [facility] Patient Information 2015 [employer] [place]. This information is not intended to replace advice given to you by your health care provider. Make sure you discuss any questions you have with your health care provider.

## 2015-04-19 ENCOUNTER — Other Ambulatory Visit: Payer: Self-pay | Admitting: General Surgery

## 2015-04-19 DIAGNOSIS — R109 Unspecified abdominal pain: Secondary | ICD-10-CM

## 2015-04-21 ENCOUNTER — Ambulatory Visit
Admission: RE | Admit: 2015-04-21 | Discharge: 2015-04-21 | Disposition: A | Discharge status: Home / Self Care | Payer: Managed Care, Other (non HMO) | Source: Ambulatory Visit | Attending: General Surgery | Admitting: General Surgery

## 2015-04-21 MED ORDER — IOPAMIDOL (ISOVUE-300) INJECTION 61%
100.0000 mL | Freq: Once | INTRAVENOUS | Status: AC | PRN
  Administered 2015-04-21: 100 mL via INTRAVENOUS

## 2015-09-20 ENCOUNTER — Emergency Department (HOSPITAL_BASED_OUTPATIENT_CLINIC_OR_DEPARTMENT_OTHER): Payer: Managed Care, Other (non HMO)

## 2015-09-20 ENCOUNTER — Encounter (HOSPITAL_BASED_OUTPATIENT_CLINIC_OR_DEPARTMENT_OTHER): Payer: Self-pay | Admitting: Emergency Medicine

## 2015-09-20 ENCOUNTER — Emergency Department (HOSPITAL_BASED_OUTPATIENT_CLINIC_OR_DEPARTMENT_OTHER)
Admission: EM | Admit: 2015-09-20 | Discharge: 2015-09-20 | Disposition: A | Discharge status: Home / Self Care | Payer: Managed Care, Other (non HMO) | Attending: Emergency Medicine | Admitting: Emergency Medicine

## 2015-09-20 DIAGNOSIS — R1032 Left lower quadrant pain: Secondary | ICD-10-CM | POA: Diagnosis present

## 2015-09-20 DIAGNOSIS — Z8719 Personal history of other diseases of the digestive system: Secondary | ICD-10-CM | POA: Diagnosis not present

## 2015-09-20 LAB — CBC WITH DIFFERENTIAL/PLATELET
Basophils Absolute: 0 10*3/uL (ref 0.0–0.1)
Basophils Relative: 0 %
EOS PCT: 0 %
Eosinophils Absolute: 0 10*3/uL (ref 0.0–0.7)
HEMATOCRIT: 37.4 % (ref 36.0–46.0)
Hemoglobin: 12.4 g/dL (ref 12.0–15.0)
Lymphocytes Relative: 23 %
Lymphs Abs: 1.9 10*3/uL (ref 0.7–4.0)
MCH: 29.5 pg (ref 26.0–34.0)
MCHC: 33.2 g/dL (ref 30.0–36.0)
MCV: 89 fL (ref 78.0–100.0)
MONO ABS: 0.4 10*3/uL (ref 0.1–1.0)
MONOS PCT: 5 %
Neutro Abs: 6 10*3/uL (ref 1.7–7.7)
Neutrophils Relative %: 72 %
PLATELETS: 298 10*3/uL (ref 150–400)
RBC: 4.2 MIL/uL (ref 3.87–5.11)
RDW: 12.3 % (ref 11.5–15.5)
WBC: 8.2 10*3/uL (ref 4.0–10.5)

## 2015-09-20 LAB — COMPREHENSIVE METABOLIC PANEL
ALBUMIN: 4.3 g/dL (ref 3.5–5.0)
ALT: 12 U/L — AB (ref 14–54)
AST: 19 U/L (ref 15–41)
Alkaline Phosphatase: 59 U/L (ref 38–126)
Anion gap: 9 (ref 5–15)
BUN: 12 mg/dL (ref 6–20)
CHLORIDE: 103 mmol/L (ref 101–111)
CO2: 24 mmol/L (ref 22–32)
Calcium: 9.2 mg/dL (ref 8.9–10.3)
Creatinine, Ser: 1.08 mg/dL — ABNORMAL HIGH (ref 0.44–1.00)
GFR calc Af Amer: >60 mL/min (ref >60)
GLUCOSE: 112 mg/dL — AB (ref 65–99)
Potassium: 4.2 mmol/L (ref 3.5–5.1)
Sodium: 136 mmol/L (ref 135–145)
Total Bilirubin: 0.6 mg/dL (ref 0.3–1.2)
Total Protein: 7.7 g/dL (ref 6.5–8.1)

## 2015-09-20 LAB — URINALYSIS, ROUTINE W REFLEX MICROSCOPIC
Bilirubin Urine: NEGATIVE
Glucose, UA: NEGATIVE mg/dL
HGB URINE DIPSTICK: NEGATIVE
KETONES UR: NEGATIVE mg/dL
Leukocytes, UA: NEGATIVE
Nitrite: NEGATIVE
PH: 8 (ref 5.0–8.0)
PROTEIN: NEGATIVE mg/dL
Specific Gravity, Urine: 1.005 (ref 1.005–1.030)

## 2015-09-20 LAB — LIPASE, BLOOD: Lipase: 16 U/L (ref 11–51)

## 2015-09-20 MED ORDER — SODIUM CHLORIDE 0.9 % IV SOLN
1000.0000 mL | INTRAVENOUS | Status: DC
  Administered 2015-09-20: 1000 mL via INTRAVENOUS

## 2015-09-20 MED ORDER — MORPHINE SULFATE (PF) 4 MG/ML IV SOLN
4.0000 mg | Freq: Once | INTRAVENOUS | Status: AC
  Administered 2015-09-20: 4 mg via INTRAVENOUS
  Filled 2015-09-20: qty 1

## 2015-09-20 MED ORDER — IOHEXOL 300 MG/ML  SOLN
25.0000 mL | Freq: Once | INTRAMUSCULAR | Status: AC | PRN
  Administered 2015-09-20: 25 mL via ORAL

## 2015-09-20 MED ORDER — DOCUSATE SODIUM 100 MG PO CAPS: 100.0000 mg | ORAL_CAPSULE | Freq: Two times a day (BID) | ORAL | Status: DC

## 2015-09-20 MED ORDER — HYDROCODONE-ACETAMINOPHEN 5-325 MG PO TABS: 1.0000 | ORAL_TABLET | ORAL | Status: DC | PRN

## 2015-09-20 MED ORDER — ONDANSETRON HCL 4 MG PO TABS: 4.0000 mg | ORAL_TABLET | Freq: Four times a day (QID) | ORAL | Status: DC

## 2015-09-20 MED ORDER — HYDROCODONE-ACETAMINOPHEN 5-325 MG PO TABS
2.0000 | ORAL_TABLET | Freq: Once | ORAL | Status: AC
  Administered 2015-09-20: 2 via ORAL
  Filled 2015-09-20: qty 2

## 2015-09-20 MED ORDER — SODIUM CHLORIDE 0.9 % IV SOLN
1000.0000 mL | Freq: Once | INTRAVENOUS | Status: AC
  Administered 2015-09-20: 1000 mL via INTRAVENOUS

## 2015-09-20 MED ORDER — IOHEXOL 300 MG/ML  SOLN
100.0000 mL | Freq: Once | INTRAMUSCULAR | Status: AC | PRN
  Administered 2015-09-20: 100 mL via INTRAVENOUS

## 2015-09-20 MED ORDER — SODIUM CHLORIDE 0.9 % IV SOLN: 1000.0000 mL | Freq: Once | INTRAVENOUS | Status: DC

## 2015-09-20 MED FILL — DOK 100 MG SOFTGEL: 100 | 50 days supply | Qty: 100 | Fill #0

## 2015-09-20 MED FILL — HYDROCODON-APAP 5-325: 5-325 | 2 days supply | Qty: 20 | Fill #0

## 2015-09-20 MED FILL — ONDANSETRON HCL 4 MG TABLET: 4 | 3 days supply | Qty: 12 | Fill #0

## 2015-09-20 NOTE — ED Notes (Signed)
Patient states she was woke up this morning around 2am with abdominal pain.  She denies vomiting or fever.

## 2015-09-20 NOTE — Discharge Instructions (Signed)
Management and warnings for early Small Bowel Obstruction. At this time, have a clear liquid only diet for the next several days. You may use Vicodin for pain control and take a stool softener (colace as prescribed) with it regularly to avoid constipation. Schedule close follow-up with the surgical clinic this week. If you cannot be seen, see your family doctor. If your symptoms are worsening in any way (increasing pain, fever, vomiting), return to [facility].  A small bowel obstruction is a blockage in the small bowel. The small bowel, which is also called the small intestine, is a long, slender tube that connects the stomach to the colon. When a person eats and drinks, food and fluids go from the stomach to the small bowel. This is where most of the nutrients in the food and fluids are absorbed. A small bowel obstruction will prevent food and fluids from passing through the small bowel as they normally do during digestion. The small bowel can become partially or completely blocked. This can cause symptoms such as abdominal pain, vomiting, and bloating. If this condition is not treated, it can be dangerous because the small bowel could rupture. CAUSES Common causes of this condition include:  Scar tissue from previous surgery or radiation treatment.  Recent surgery. This may cause the movements of the bowel to slow down and cause food to block the intestine.  Hernias.  Inflammatory bowel disease (colitis).  Twisting of the bowel (volvulus).  Tumors.  A foreign body.  Slipping of a part of the bowel into another part (intussusception). SYMPTOMS Symptoms of this condition include:  Abdominal pain. This may be dull cramps or sharp pain. It may occur in one area, or it may be present in the entire abdomen. Pain can range from mild to severe, depending on the degree of obstruction.  Nausea and vomiting. Vomit may be greenish or a yellow bile  color.  Abdominal bloating.  Constipation.  Lack of passing gas.  Frequent belching.  Diarrhea. This may occur if the obstruction is partial and runny stool is able to leak around the obstruction. DIAGNOSIS This condition may be diagnosed based on a physical exam, medical history, and X-rays of the abdomen. You may also have other tests, such as a CT scan of the abdomen and pelvis. TREATMENT Treatment for this condition depends on the cause and severity of the problem. Treatment options may include:  Bed rest along with fluids and pain medicines that are given through an IV tube inserted into one of your veins. Sometimes, this is all that is needed for the obstruction to improve.  Following a simple diet. In some cases, a clear liquid diet may be required for several days. This allows the bowel to rest.  Placement of a small tube (nasogastric tube) into the stomach. When the bowel is blocked, it usually swells up like a balloon that is filled with air and fluids. The air and fluids may be removed by suction through the nasogastric tube. This can help with pain, discomfort, and nausea. It can also help the obstruction to clear up faster.  Surgery. This may be required if other treatments do not work. Bowel obstruction from a hernia may require early surgery and can be an emergency procedure. Surgery may also be required for scar tissue that causes frequent or severe obstructions. HOME CARE INSTRUCTIONS  Get plenty of rest.  Follow instructions from your health care provider about eating restrictions. You may need to  avoid solid foods and consume only clear liquids until your condition improves.  Take over-the-counter and prescription medicines only as told by your health care provider.  Keep all follow-up visits as told by your health care provider. This is important. SEEK MEDICAL CARE IF:  You have a fever.  You have chills. SEEK IMMEDIATE MEDICAL CARE IF:  You have increased  pain or cramping.  You vomit blood.  You have uncontrolled vomiting or nausea.  You cannot drink fluids because of vomiting or pain.  You develop confusion.  You begin feeling very dry or thirsty (dehydrated).  You have severe bloating.  You feel extremely weak or you faint.   This information is not intended to replace advice given to you by your health care provider. Make sure you discuss any questions you have with your health care provider.   Document Released: 11/13/2005 Document Revised: 05/18/2015 Document Reviewed: 10/21/2014 Elsevier Interactive Patient Education 2016 [employer] Clear Liquid Diet A clear liquid diet is a short-term diet that is prescribed to provide the necessary fluid and basic energy you need when you can have nothing else. The clear liquid diet consists of liquids or solids that will become liquid at room temperature. You should be able to see through the liquid. There are many reasons that you may be restricted to clear liquids, such as:  When you have a sudden-onset (acute) condition that occurs before or after surgery.  To help your body slowly get adjusted to food again after a long period when you were unable to have food.  Replacement of fluids when you have a diarrheal disease.  When you are going to have certain exams, such as a colonoscopy, in which instruments are inserted inside your body to look at parts of your digestive system. WHAT CAN I HAVE? A clear liquid diet does not provide all the nutrients you need. It is important to choose a variety of the following items to get as many nutrients as possible:  Vegetable juices that do not have pulp.  Fruit juices and fruit drinks that do not have pulp.  Coffee (regular or decaffeinated), tea, or soda at the discretion of your health care provider.  Clear bouillon, broth, or strained broth-based soups.  High-protein and flavored gelatins.  Sugar or honey.  Ices or frozen ice pops  that do not contain milk. If you are not sure whether you can have certain items, you should ask your health care provider. You may also ask your health care provider if there are any other clear liquid options.   This information is not intended to replace advice given to you by your health care provider. Make sure you discuss any questions you have with your health care provider.   Document Released: 08/27/2005 Document Revised: 09/01/2013 Document Reviewed: 07/24/2013 Elsevier Interactive Patient Education [employer]

## 2015-09-20 NOTE — ED Provider Notes (Signed)
CSN: [id number]     Arrival date & time 09/20/15  0907 History   First MD Initiated Contact with Patient 09/20/15 [phone]     Chief Complaint  Patient presents with  . Abdominal Pain     (Consider location/radiation/quality/duration/timing/severity/associated sxs/prior Treatment) HPI Patient awakened at approximately 2 AM with severe left lower quadrant pain. She reports that she has had nausea but no vomiting. She denies diarrhea. She reports over the past 2 days she has noted increasing abdominal distention and bloating. She reports normal bowel movement today. Patient reports that the pain is waxing and waning in severity. It has a burning and cramping quality to it. Pain radiates from her left mid quadrant to her lower quadrant and somewhat into the groin. Pain is worse with movements. She reports that may spike up to a 10 and then eased down to a 5. Patient has history of small bowel obstruction and adhesions. She reports that was the concern today. She denies flank pain. She denies pain burning or urgency with urination. Patient has had partial hysterectomy. She denies vaginal discharge. Past Medical History  Diagnosis Date  . Bowel obstruction [facility])    Past Surgical History  Procedure Laterality Date  . Abdominal hysterectomy    . Hernia repair    . Urethra surgery     History reviewed. No pertinent family history. Social History  Substance Use Topics  . Smoking status: Never Smoker   . Smokeless tobacco: None  . Alcohol Use: No   OB History    No data available     Review of Systems 10 Systems reviewed and are negative for acute change except as noted in the HPI.    Allergies  Aspirin  Home Medications   Prior to Admission medications   Medication Sig Start Date End Date Taking? Authorizing Provider  docusate sodium (COLACE) 100 MG capsule Take 1 capsule (100 mg total) by mouth every 12 (twelve) hours. 09/20/15   Arby Barrette, MD  HYDROcodone-acetaminophen  (NORCO/VICODIN) 5-325 MG tablet Take 1-2 tablets by mouth every 4 (four) hours as needed for moderate pain or severe pain. 09/20/15   Arby Barrette, MD  ondansetron (ZOFRAN) 4 MG tablet Take 1 tablet (4 mg total) by mouth every 6 (six) hours. 09/20/15   Arby Barrette, MD   BP 132/78 mmHg  Pulse 70  Temp(Src) 98 F (36.7 C) (Oral)  Resp 16  Ht 5\' 9"  (1.753 m)  Wt 165 lb (74.844 kg)  BMI 24.36 kg/m2  SpO2 100% Physical Exam  Constitutional: She is oriented to person, place, and time. She appears well-developed and well-nourished.  HENT:  Head: Normocephalic and atraumatic.  Eyes: EOM are normal. Pupils are equal, round, and reactive to light.  Neck: Neck supple.  Cardiovascular: Normal rate, regular rhythm, normal heart sounds and intact distal pulses.   Pulmonary/Chest: Effort normal and breath sounds normal.  Abdominal: Soft. Bowel sounds are normal. She exhibits no distension. There is tenderness.  Patient has tenderness in the suprapubic region as well as the left lower quadrant. No guarding or rebound. No masses in the groin.  Musculoskeletal: Normal range of motion. She exhibits no edema or tenderness.  Neurological: She is alert and oriented to person, place, and time. She has normal strength. Coordination normal. GCS eye subscore is 4. GCS verbal subscore is 5. GCS motor subscore is 6.  Skin: Skin is warm, dry and intact.  Psychiatric: She has a normal mood and affect.    ED Course  Procedures (including critical care time) Labs Review Labs Reviewed  COMPREHENSIVE METABOLIC PANEL - Abnormal; Notable for the following:    Glucose, Bld 112 (*)    Creatinine, Ser 1.08 (*)    ALT 12 (*)    All other components within normal limits  LIPASE, BLOOD  CBC WITH DIFFERENTIAL/PLATELET  URINALYSIS, ROUTINE W REFLEX MICROSCOPIC (NOT AT [facility])    Imaging Review Ct Abdomen Pelvis W Contrast  09/20/2015  CLINICAL DATA:  Abdominal pain radiating into the pelvis today with nausea.  History of hysterectomy and hernia repair. EXAM: CT ABDOMEN AND PELVIS WITH CONTRAST TECHNIQUE: Multidetector CT imaging of the abdomen and pelvis was performed using the standard protocol following bolus administration of intravenous contrast. CONTRAST:  100mL OMNIPAQUE IOHEXOL 300 MG/ML SOLN, 25mL OMNIPAQUE IOHEXOL 300 MG/ML SOLN COMPARISON:  04/21/2015. FINDINGS: Lower chest: There is a stable 3 mm left lower lobe nodule on image number 7, consistent with a benign finding based on stability from prior studies. The lung bases are otherwise clear. No significant pleural or pericardial effusion. Hepatobiliary: Several small low-density hepatic lesions are again noted, unchanged and without evidence of enhancement on the delayed images, consistent with cysts. No suspicious hepatic findings. No evidence of gallstones, gallbladder wall thickening or biliary dilatation. Pancreas: Unremarkable. No pancreatic ductal dilatation or surrounding inflammatory changes. Spleen: Normal in size without focal abnormality. Adrenals/Urinary Tract: Both adrenal glands appear normal. The kidneys appear stable with bilateral extrarenal pelves. No evidence of urinary tract calculus, renal mass or hydronephrosis. The bladder is mildly distended without apparent focal abnormality. Stomach/Bowel: There is borderline dilated small bowel in the lower abdomen. No evidence of bowel wall thickening, significant distention or surrounding inflammatory change. Mild sigmoid colon diverticular changes are present. The appendix appears normal. Vascular/Lymphatic: There are no enlarged abdominal or pelvic lymph nodes. No significant vascular findings are present. Reproductive: Hysterectomy. No suspicious adnexal findings. There is probably some residual ovarian tissue bilaterally which appears unchanged. Other: Stable postsurgical changes in the anterior abdominal wall status post hernia repair. No significant recurrent hernia. Musculoskeletal: No  acute or significant osseous findings. IMPRESSION: 1. Borderline small bowel dilatation in the low abdomen. No evidence of bowel obstruction, perforation or appendicitis. 2. Stable postsurgical changes in the anterior abdominal wall status post hernia repair. 3. Stable hepatic cysts. Electronically Signed   By: Carey BullocksWilliam  Veazey M.D.   On: 09/20/2015 10:59   I have personally reviewed and evaluated these images and lab results as part of my medical decision-making.   EKG Interpretation None     Consult: Patient's case was reviewed or general surgery Dr. Kae Hellerosenbaum. At this time patient is to be hydrated and discharged on a liquid diet. Instructions are to return if she is not tolerating liquids or worsening symptoms. MDM   Final diagnoses:  Left lower quadrant pain  History of small bowel obstruction   Patient is alert and nontoxic with no perineal signs on abdominal examination. At this time CT does not show acute obstruction. The patient's case was reviewed with general surgery. We will initiate outpatient therapy for early small bowel instruction with clear liquid diet and pain control. Patient is counseled on signs and symptoms for which return. She is given instructions for close follow-up.    Arby BarretteMarcy Dazhane Villagomez, MD 09/20/15 [phone]

## 2015-11-28 ENCOUNTER — Other Ambulatory Visit: Payer: Self-pay | Admitting: General Surgery

## 2015-11-28 ENCOUNTER — Encounter (HOSPITAL_BASED_OUTPATIENT_CLINIC_OR_DEPARTMENT_OTHER): Payer: Self-pay | Admitting: Emergency Medicine

## 2015-11-28 ENCOUNTER — Inpatient Hospital Stay (HOSPITAL_BASED_OUTPATIENT_CLINIC_OR_DEPARTMENT_OTHER)
Admission: EM | Admit: 2015-11-28 | Discharge: 2015-12-01 | DRG: 390 | Disposition: A | Discharge status: Home / Self Care | Payer: 59 | Attending: Surgery | Admitting: Surgery

## 2015-11-28 ENCOUNTER — Emergency Department (HOSPITAL_BASED_OUTPATIENT_CLINIC_OR_DEPARTMENT_OTHER): Payer: 59

## 2015-11-28 DIAGNOSIS — K5660 Unspecified intestinal obstruction: Secondary | ICD-10-CM | POA: Diagnosis present

## 2015-11-28 DIAGNOSIS — R109 Unspecified abdominal pain: Secondary | ICD-10-CM | POA: Diagnosis not present

## 2015-11-28 DIAGNOSIS — Z90710 Acquired absence of both cervix and uterus: Secondary | ICD-10-CM

## 2015-11-28 DIAGNOSIS — K56609 Unspecified intestinal obstruction, unspecified as to partial versus complete obstruction: Secondary | ICD-10-CM | POA: Diagnosis present

## 2015-11-28 LAB — CREATININE, SERUM: Creatinine, Ser: 0.96 mg/dL (ref 0.44–1.00)

## 2015-11-28 LAB — CBC
HCT: 35.2 % — ABNORMAL LOW (ref 36.0–46.0)
HEMATOCRIT: 33.9 % — AB (ref 36.0–46.0)
HEMOGLOBIN: 12 g/dL (ref 12.0–15.0)
HEMOGLOBIN: 11.5 g/dL — AB (ref 12.0–15.0)
MCH: 30.1 pg (ref 26.0–34.0)
MCH: 29.3 pg (ref 26.0–34.0)
MCHC: 34.1 g/dL (ref 30.0–36.0)
MCHC: 33.9 g/dL (ref 30.0–36.0)
MCV: 88.2 fL (ref 78.0–100.0)
MCV: 86.5 fL (ref 78.0–100.0)
Platelets: 301 10*3/uL (ref 150–400)
Platelets: 317 10*3/uL (ref 150–400)
RBC: 3.99 MIL/uL (ref 3.87–5.11)
RBC: 3.92 MIL/uL (ref 3.87–5.11)
RDW: 12.2 % (ref 11.5–15.5)
RDW: 12.8 % (ref 11.5–15.5)
WBC: 8.6 10*3/uL (ref 4.0–10.5)
WBC: 6.3 10*3/uL (ref 4.0–10.5)

## 2015-11-28 LAB — URINALYSIS, ROUTINE W REFLEX MICROSCOPIC
Bilirubin Urine: NEGATIVE
Glucose, UA: NEGATIVE mg/dL
HGB URINE DIPSTICK: NEGATIVE
Ketones, ur: NEGATIVE mg/dL
Leukocytes, UA: NEGATIVE
Nitrite: NEGATIVE
Protein, ur: NEGATIVE mg/dL
SPECIFIC GRAVITY, URINE: 1.034 — AB (ref 1.005–1.030)
pH: 7 (ref 5.0–8.0)

## 2015-11-28 LAB — COMPREHENSIVE METABOLIC PANEL
ALT: 15 U/L (ref 14–54)
ANION GAP: 7 (ref 5–15)
AST: 19 U/L (ref 15–41)
Albumin: 3.9 g/dL (ref 3.5–5.0)
Alkaline Phosphatase: 48 U/L (ref 38–126)
BUN: 15 mg/dL (ref 6–20)
CHLORIDE: 108 mmol/L (ref 101–111)
CO2: 22 mmol/L (ref 22–32)
Calcium: 9 mg/dL (ref 8.9–10.3)
Creatinine, Ser: 1.02 mg/dL — ABNORMAL HIGH (ref 0.44–1.00)
GFR calc non Af Amer: >60 mL/min (ref >60)
Glucose, Bld: 113 mg/dL — ABNORMAL HIGH (ref 65–99)
Potassium: 4.2 mmol/L (ref 3.5–5.1)
SODIUM: 137 mmol/L (ref 135–145)
Total Bilirubin: 0.6 mg/dL (ref 0.3–1.2)
Total Protein: 7 g/dL (ref 6.5–8.1)

## 2015-11-28 LAB — LIPASE, BLOOD: LIPASE: 22 U/L (ref 11–51)

## 2015-11-28 MED ORDER — ONDANSETRON 4 MG PO TBDP: 4.0000 mg | ORAL_TABLET | Freq: Four times a day (QID) | ORAL | Status: DC | PRN

## 2015-11-28 MED ORDER — HYDROMORPHONE HCL 1 MG/ML IJ SOLN
1.0000 mg | Freq: Once | INTRAMUSCULAR | Status: AC
  Administered 2015-11-28: 1 mg via INTRAVENOUS
  Filled 2015-11-28: qty 1

## 2015-11-28 MED ORDER — IOHEXOL 300 MG/ML  SOLN
25.0000 mL | Freq: Once | INTRAMUSCULAR | Status: AC | PRN
  Administered 2015-11-28: 25 mL via ORAL

## 2015-11-28 MED ORDER — CHLORHEXIDINE GLUCONATE 0.12 % MT SOLN
15.0000 mL | Freq: Two times a day (BID) | OROMUCOSAL | Status: DC
  Administered 2015-11-28 – 2015-12-01 (×5): 15 mL via OROMUCOSAL
  Filled 2015-11-28 (×8): qty 15

## 2015-11-28 MED ORDER — HYDROMORPHONE HCL 1 MG/ML IJ SOLN
0.5000 mg | INTRAMUSCULAR | Status: DC | PRN
  Administered 2015-11-28 – 2015-11-29 (×3): 1 mg via INTRAVENOUS
  Filled 2015-11-28 (×3): qty 1

## 2015-11-28 MED ORDER — HYDROMORPHONE HCL 1 MG/ML IJ SOLN
0.5000 mg | Freq: Once | INTRAMUSCULAR | Status: AC
  Administered 2015-11-28: 0.5 mg via INTRAVENOUS
  Filled 2015-11-28: qty 1

## 2015-11-28 MED ORDER — ONDANSETRON HCL 4 MG/2ML IJ SOLN
4.0000 mg | Freq: Once | INTRAMUSCULAR | Status: AC
  Administered 2015-11-28: 4 mg via INTRAVENOUS
  Filled 2015-11-28: qty 2

## 2015-11-28 MED ORDER — HEPARIN SODIUM (PORCINE) 5000 UNIT/ML IJ SOLN
5000.0000 [IU] | Freq: Three times a day (TID) | INTRAMUSCULAR | Status: DC
  Administered 2015-11-28 – 2015-11-29 (×2): 5000 [IU] via SUBCUTANEOUS
  Filled 2015-11-28 (×11): qty 1

## 2015-11-28 MED ORDER — ONDANSETRON HCL 4 MG/2ML IJ SOLN
4.0000 mg | Freq: Four times a day (QID) | INTRAMUSCULAR | Status: DC | PRN
  Administered 2015-11-28: 4 mg via INTRAVENOUS
  Filled 2015-11-28: qty 2

## 2015-11-28 MED ORDER — PANTOPRAZOLE SODIUM 40 MG IV SOLR
40.0000 mg | Freq: Every day | INTRAVENOUS | Status: DC
  Filled 2015-11-28 (×3): qty 40

## 2015-11-28 MED ORDER — DIPHENHYDRAMINE HCL 25 MG PO CAPS: 25.0000 mg | ORAL_CAPSULE | Freq: Four times a day (QID) | ORAL | Status: DC | PRN

## 2015-11-28 MED ORDER — POTASSIUM CHLORIDE IN NACL 20-0.9 MEQ/L-% IV SOLN
INTRAVENOUS | Status: DC
  Administered 2015-11-28 – 2015-11-29 (×2): via INTRAVENOUS
  Filled 2015-11-28 (×3): qty 1000

## 2015-11-28 MED ORDER — FENTANYL CITRATE (PF) 100 MCG/2ML IJ SOLN
100.0000 ug | Freq: Once | INTRAMUSCULAR | Status: AC
  Administered 2015-11-28: 100 ug via INTRAVENOUS
  Filled 2015-11-28: qty 2

## 2015-11-28 MED ORDER — IOHEXOL 300 MG/ML  SOLN
100.0000 mL | Freq: Once | INTRAMUSCULAR | Status: AC | PRN
  Administered 2015-11-28: 100 mL via INTRAVENOUS

## 2015-11-28 MED ORDER — SODIUM CHLORIDE 0.9 % IV SOLN
INTRAVENOUS | Status: AC
  Administered 2015-11-28: 10:00:00 via INTRAVENOUS

## 2015-11-28 MED ORDER — DIPHENHYDRAMINE HCL 50 MG/ML IJ SOLN: 25.0000 mg | Freq: Four times a day (QID) | INTRAMUSCULAR | Status: DC | PRN

## 2015-11-28 NOTE — H&P (Signed)
Renee Murphy is an 45 y.o. female.   Chief Complaint: ACUTE ONSET OF ABDOMINAL PAIN HPI:  Pt reports waking up this AM with abdominal pain around 2 Am, pain severe in LLQ.  She has had some increased bloating and distension over 2 days prior to onset of pain last PM.  She has prior hx of SBO, and was here 7/15-7/19/16 for SBO.  She has hx of multiple abdominal surgeries and VHR with mesh.  Most of this has been done in [facility] and we have no record of it.   She is transferred to [facility] today from [facility] for admission.    Past Medical History  Diagnosis Date  . Bowel obstruction January 2017, not admitted SBO admitted 7/15-7/19/16 here in [place] Other admits at [facility]       Past Surgical History  Procedure Laterality Date  . Abdominal hysterectomy Ureteral tear with repair   [place] 2011  . Hernia repair [place]  2016  . Urethra surgery      History reviewed. No pertinent family history. Social History:  reports that she has never smoked. She does not have any smokeless tobacco history on file. She reports that she does not drink alcohol or use illicit drugs.  Allergies:  Allergies  Allergen Reactions  . Aspirin Nausea And Vomiting    Medications Prior to Admission  Medication Sig Dispense Refill  . azithromycin (ZITHROMAX) 250 MG tablet Take 250 mg by mouth daily.    . benzonatate (TESSALON) 200 MG capsule Take 200 mg by mouth 3 (three) times daily as needed for cough.    . chlorpheniramine-HYDROcodone (TUSSIONEX) 10-8 MG/5ML SUER Take 5 mLs by mouth every 12 (twelve) hours as needed. Cough for up to 7 days.    . Menthol (HALLS COUGH DROPS MT) Use as directed in the mouth or throat as needed (cough.).    Marland Kitchen docusate sodium (COLACE) 100 MG capsule Take 1 capsule (100 mg total) by mouth every 12 (twelve) hours. (Patient not taking: Reported on 11/28/2015) 60 capsule 0  . HYDROcodone-acetaminophen (NORCO/VICODIN) 5-325 MG tablet Take 1-2 tablets by mouth  every 4 (four) hours as needed for moderate pain or severe pain. 20 tablet 0  . ondansetron (ZOFRAN) 4 MG tablet Take 1 tablet (4 mg total) by mouth every 6 (six) hours. 12 tablet 0    Results for orders placed or performed during the hospital encounter of 11/28/15 (from the past 48 hour(s))  Lipase, blood     Status: None   Collection Time: 11/28/15  6:43 AM  Result Value Ref Range   Lipase 22 11 - 51 U/L  Comprehensive metabolic panel     Status: Abnormal   Collection Time: 11/28/15  6:43 AM  Result Value Ref Range   Sodium 137 135 - 145 mmol/L   Potassium 4.2 3.5 - 5.1 mmol/L   Chloride 108 101 - 111 mmol/L   CO2 22 22 - 32 mmol/L   Glucose, Bld 113 (H) 65 - 99 mg/dL   BUN 15 6 - 20 mg/dL   Creatinine, Ser 1.02 (H) 0.44 - 1.00 mg/dL   Calcium 9.0 8.9 - 10.3 mg/dL   Total Protein 7.0 6.5 - 8.1 g/dL   Albumin 3.9 3.5 - 5.0 g/dL   AST 19 15 - 41 U/L   ALT 15 14 - 54 U/L   Alkaline Phosphatase 48 38 - 126 U/L   Total Bilirubin 0.6 0.3 - 1.2 mg/dL   GFR calc non  Af Amer >60 >60 mL/min   GFR calc Af Amer >60 >60 mL/min    Comment: (NOTE) The eGFR has been calculated using the CKD EPI equation. This calculation has not been validated in all clinical situations. eGFR's persistently <60 mL/min signify possible Chronic Kidney Disease.    Anion gap 7 5 - 15  CBC     Status: Abnormal   Collection Time: 11/28/15  6:43 AM  Result Value Ref Range   WBC 8.6 4.0 - 10.5 K/uL   RBC 3.99 3.87 - 5.11 MIL/uL   Hemoglobin 12.0 12.0 - 15.0 g/dL   HCT 35.2 (L) 36.0 - 46.0 %   MCV 88.2 78.0 - 100.0 fL   MCH 30.1 26.0 - 34.0 pg   MCHC 34.1 30.0 - 36.0 g/dL   RDW 12.2 11.5 - 15.5 %   Platelets 301 150 - 400 K/uL  Urinalysis, Routine w reflex microscopic (not at [facility])     Status: Abnormal   Collection Time: 11/28/15  8:29 AM  Result Value Ref Range   Color, Urine YELLOW YELLOW   APPearance CLEAR CLEAR   Specific Gravity, Urine 1.034 (H) 1.005 - 1.030   pH 7.0 5.0 - 8.0   Glucose, UA  NEGATIVE NEGATIVE mg/dL   Hgb urine dipstick NEGATIVE NEGATIVE   Bilirubin Urine NEGATIVE NEGATIVE   Ketones, ur NEGATIVE NEGATIVE mg/dL   Protein, ur NEGATIVE NEGATIVE mg/dL   Nitrite NEGATIVE NEGATIVE   Leukocytes, UA NEGATIVE NEGATIVE    Comment: MICROSCOPIC NOT DONE ON URINES WITH NEGATIVE PROTEIN, BLOOD, LEUKOCYTES, NITRITE, OR GLUCOSE <1000 mg/dL.   Ct Abdomen Pelvis W Contrast  11/28/2015  CLINICAL DATA:  Left lower quadrant pain starting today 1 a.m., history of bowel obstruction, post hysterectomy who EXAM: CT ABDOMEN AND PELVIS WITH CONTRAST TECHNIQUE: Multidetector CT imaging of the abdomen and pelvis was performed using the standard protocol following bolus administration of intravenous contrast. CONTRAST:  62m OMNIPAQUE IOHEXOL 300 MG/ML SOLN, 1053mOMNIPAQUE IOHEXOL 300 MG/ML SOLN COMPARISON:  09/20/2015 FINDINGS: Lower chest:  Mild dependent atelectasis noted posteriorly. Hepatobiliary: Subcentimeter scattered hepatic cysts are stable. No intrahepatic biliary ductal dilatation. No calcified gallstones are noted within gallbladder. Pancreas: No focal mass or inflammatory changes Spleen: Normal in size without focal abnormality. Adrenals/Urinary Tract: Adrenal glands are unremarkable. Kidneys shows symmetrical enhancement. Lobulated contour of the left kidney again noted. No hydronephrosis or hydroureter. Delayed renal images shows bilateral renal symmetrical excretion. Bilateral visualized proximal ureter is unremarkable. The urinary bladder is unremarkable. Stomach/Bowel: There is no gastric outlet obstruction. Contrast material noted within stomach. Some contrast material noted within proximal small bowel loops. There are mild distended small bowel loops in mid lower abdomen and pelvis with some air-fluid levels. There is focal dilatation of small bowel with fecal like material axial image 68 in left lower pelvis just anterior to the urinary bladder. Beyond this point the small bowel is  small caliber decompressed. Findings are highly suspicious for small bowel obstruction. No any contrast material noted within distal small bowel or within colon. No pericecal inflammation. Normal appendix is noted in axial image 59. There is some colonic stool in right colon transverse colon and proximal descending colon. Some colonic stool noted in distal sigmoid colon and rectum. Vascular/Lymphatic: No mesenteric adenopathy. Reproductive: The patient is status post hysterectomy. No pelvic adenopathy. Other: Small amount of mesenteric free fluid noted within central pelvis just anterior to the urinary bladder axial image 64. There is no free abdominal air. Musculoskeletal: Postsurgical changes post  hernia repair noted umbilical region. Sagittal images of the spine shows no destructive bony lesions. Coronal images shows unremarkable bilateral hip joints. IMPRESSION: 1. Mild dilated small bowel loops are noted in mid lower abdomen and pelvis with some air-fluid levels. There is focal mild dilatation of small bowel in axial image 68 in left lower pelvis with fecal like material. Measures about 3.6 cm in diameter. Beyond this point the small bowel is collapsed small caliber. Findings are highly suspicious for small bowel obstruction. No any contrast material noted within distal small bowel or within colon. 2. No pericecal inflammation.  Normal appendix. 3. Tiny amount of mesenteric free fluid noted in mid lower pelvis axial image 64. No free abdominal air. 4. Status post hysterectomy. 5. Postsurgical changes post hernia repair umbilical region Electronically Signed   By: Lahoma Crocker M.D.   On: 11/28/2015 08:24    Review of Systems  Constitutional: Negative for fever, chills, weight loss, malaise/fatigue and diaphoresis.  HENT: Negative.   Eyes: Negative.   Respiratory: Negative.        Started on Azithromycin last Saturday 3/18 for 5 day course for URI  Cardiovascular: Positive for chest pain (11/19/15 with some  inspirational pain, last a few hours then resolved.). Negative for palpitations, orthopnea, claudication, leg swelling and PND.  Gastrointestinal: Positive for nausea and abdominal pain. Negative for vomiting, diarrhea, constipation, blood in stool and melena.       Last BM 11/26/15  Genitourinary: Negative.   Musculoskeletal: Negative.   Skin: Negative.   Neurological: Negative.   Endo/Heme/Allergies: Negative for environmental allergies and polydipsia. Does not bruise/bleed easily.  Psychiatric/Behavioral: Negative.     Blood pressure 143/85, pulse 74, temperature 98.1 F (36.7 C), temperature source Oral, resp. rate 18, height 5' 9.5" (1.765 m), weight 79.379 kg (175 lb), SpO2 100 %. Physical Exam  Constitutional: She is oriented to person, place, and time. She appears well-developed and well-nourished. No distress.  HENT:  Head: Normocephalic and atraumatic.  Mouth/Throat: No oropharyngeal exudate.  Eyes: Right eye exhibits discharge. Left eye exhibits no discharge. No scleral icterus.  Neck: Normal range of motion. Neck supple. No JVD present. No tracheal deviation present. No thyromegaly present.  Cardiovascular: Normal rate, regular rhythm, normal heart sounds and intact distal pulses.   No murmur heard. Respiratory: Effort normal and breath sounds normal. No respiratory distress. She has no wheezes. She has no rales. She exhibits no tenderness.  GI: Soft. She exhibits distension. She exhibits no mass. There is tenderness (mILD LEFT GROIN, LLQ TO BACK). There is no rebound and no guarding.  VERY HYPOACTIVE BOWEL SOUNDS Midline scar well healed.  Musculoskeletal: Normal range of motion. She exhibits no edema or tenderness.  Lymphadenopathy:    She has no cervical adenopathy.  Neurological: She is alert and oriented to person, place, and time. No cranial nerve deficit.  Skin: Skin is warm and dry. No rash noted. She is not diaphoretic. No erythema. No pallor.  Psychiatric: She has  a normal mood and affect. Her behavior is normal. Judgment and thought content normal.     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  Her last couple episodes resolved with conservative management.  We will try to do the same now.  We will place an NG if she is starts vomiting, otherwise  hydration and bowel rest.     Mieke Brinley, PA-C 11/28/2015, 4:23 PM

## 2015-11-28 NOTE — ED Notes (Addendum)
Patient presents to ED with complaint of lower left quadrant abdominal pain that began at 0100 this morning.  Patient states she was awoken from the pain and that it radiates across her lower abdominal to the right side.  Patient states that this feels similar to the times when she had bowel obstruction.

## 2015-11-28 NOTE — ED Notes (Signed)
Report given to Nicole EMT with [facility]. 

## 2015-11-28 NOTE — ED Notes (Signed)
Care turned over to [facility] transport. 

## 2015-11-28 NOTE — ED Provider Notes (Signed)
CSN: [id number]     Arrival date & time 11/28/15  6213 History   First MD Initiated Contact with Patient 11/28/15 [phone]     Chief Complaint  Patient presents with  . Abdominal Pain     (Consider location/radiation/quality/duration/timing/severity/associated sxs/prior Treatment) HPI Patient presents with left lower quadrant pain waking her from sleep at 0100. States the pain is spasmodic in nature. Associated with nausea but without vomiting or diarrhea. Last bowel movement was 2 days ago. 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 Denies any fever or chills. Denies urinary symptoms. No vaginal bleeding or discharge. She was recently started on Tessalon Perles and Z-Pak for URI symptoms Past Medical History  Diagnosis Date  . Bowel obstruction [facility])    Past Surgical History  Procedure Laterality Date  . Abdominal hysterectomy    . Hernia repair    . Urethra surgery     History reviewed. No pertinent family history. Social History  Substance Use Topics  . Smoking status: Never Smoker   . Smokeless tobacco: None  . Alcohol Use: No   OB History    No data available     Review of Systems  Constitutional: Negative for fever and chills.  HENT: Positive for congestion.   Respiratory: Positive for cough. Negative for shortness of breath.   Cardiovascular: Positive for chest pain.  Gastrointestinal: Positive for nausea and abdominal pain. Negative for vomiting, diarrhea and constipation.  Genitourinary: Negative for dysuria, frequency, flank pain, vaginal bleeding and vaginal discharge.  Musculoskeletal: Negative for myalgias, back pain, neck pain and neck stiffness.  Skin: Negative for rash and wound.  Neurological: Negative for dizziness, weakness, light-headedness, numbness and headaches.  All other systems reviewed and are negative.     Allergies  Aspirin  Home Medications   Prior to Admission medications   Medication Sig Start Date End  Date Taking? Authorizing Provider  azithromycin (ZITHROMAX) 250 MG tablet Take 250 mg by mouth daily. 11/26/15  Yes Historical Provider, MD  benzonatate (TESSALON) 200 MG capsule Take 200 mg by mouth 3 (three) times daily as needed for cough.   Yes Historical Provider, MD  docusate sodium (COLACE) 100 MG capsule Take 1 capsule (100 mg total) by mouth every 12 (twelve) hours. 09/20/15   Arby Barrette, MD  HYDROcodone-acetaminophen (NORCO/VICODIN) 5-325 MG tablet Take 1-2 tablets by mouth every 4 (four) hours as needed for moderate pain or severe pain. 09/20/15   Arby Barrette, MD  ondansetron (ZOFRAN) 4 MG tablet Take 1 tablet (4 mg total) by mouth every 6 (six) hours. 09/20/15   Arby Barrette, MD   BP 120/70 mmHg  Pulse 63  Temp(Src) 98 F (36.7 C) (Oral)  Resp 22  Ht 5' 9.5" (1.765 m)  Wt 175 lb (79.379 kg)  BMI 25.48 kg/m2  SpO2 98% Physical Exam  Constitutional: She is oriented to person, place, and time. She appears well-developed and well-nourished. No distress.  HENT:  Head: Normocephalic and atraumatic.  Mouth/Throat: Oropharynx is clear and moist.  Eyes: EOM are normal. Pupils are equal, round, and reactive to light.  Neck: Normal range of motion. Neck supple.  Cardiovascular: Normal rate and regular rhythm.   Pulmonary/Chest: Effort normal and breath sounds normal. No respiratory distress. She has no wheezes. She has no rales.  Abdominal: Soft. Bowel sounds are normal. She exhibits no distension and no mass. There is tenderness (tender to palpation in the left lower quadrant without rebound or guarding.). There is no rebound  and no guarding.  Musculoskeletal: Normal range of motion. She exhibits no edema or tenderness.  No flank tenderness. No lower extremity asymmetry or swelling  Neurological: She is alert and oriented to person, place, and time.  Moves all extremities without deficit. Sensation is fully intact.  Skin: Skin is warm and dry. No rash noted. No erythema.   Psychiatric: She has a normal mood and affect. Her behavior is normal.  Nursing note and vitals reviewed.   ED Course  Procedures (including critical care time) Labs Review Labs Reviewed  COMPREHENSIVE METABOLIC PANEL - Abnormal; Notable for the following:    Glucose, Bld 113 (*)    Creatinine, Ser 1.02 (*)    All other components within normal limits  CBC - Abnormal; Notable for the following:    HCT 35.2 (*)    All other components within normal limits  URINALYSIS, ROUTINE W REFLEX MICROSCOPIC (NOT AT [facility]) - Abnormal; Notable for the following:    Specific Gravity, Urine 1.034 (*)    All other components within normal limits  LIPASE, BLOOD    Imaging Review Ct Abdomen Pelvis W Contrast  11/28/2015  CLINICAL DATA:  Left lower quadrant pain starting today 1 a.m., history of bowel obstruction, post hysterectomy who EXAM: CT ABDOMEN AND PELVIS WITH CONTRAST TECHNIQUE: Multidetector CT imaging of the abdomen and pelvis was performed using the standard protocol following bolus administration of intravenous contrast. CONTRAST:  25mL OMNIPAQUE IOHEXOL 300 MG/ML SOLN, OMNIPAQUE IOHEXOL 300 MG/ML SOLN COMPARISON:  09/20/2015 FINDINGS: Lower chest:  Mild dependent atelectasis noted posteriorly. Hepatobiliary: Subcentimeter scattered hepatic cysts are stable. No intrahepatic biliary ductal dilatation. No calcified gallstones are noted within gallbladder. Pancreas: No focal mass or inflammatory changes Spleen: Normal in size without focal abnormality. Adrenals/Urinary Tract: Adrenal glands are unremarkable. Kidneys shows symmetrical enhancement. Lobulated contour of the left kidney again noted. No hydronephrosis or hydroureter. Delayed renal images shows bilateral renal symmetrical excretion. Bilateral visualized proximal ureter is unremarkable. The urinary bladder is unremarkable. Stomach/Bowel: There is no gastric outlet obstruction. Contrast material noted within stomach. Some contrast  material noted within proximal small bowel loops. There are mild distended small bowel loops in mid lower abdomen and pelvis with some air-fluid levels. There is focal dilatation of small bowel with fecal like material axial image 68 in left lower pelvis just anterior to the urinary bladder. Beyond this point the small bowel is small caliber decompressed. Findings are highly suspicious for small bowel obstruction. No any contrast material noted within distal small bowel or within colon. No pericecal inflammation. Normal appendix is noted in axial image 59. There is some colonic stool in right colon transverse colon and proximal descending colon. Some colonic stool noted in distal sigmoid colon and rectum. Vascular/Lymphatic: No mesenteric adenopathy. Reproductive: The patient is status post hysterectomy. No pelvic adenopathy. Other: Small amount of mesenteric free fluid noted within central pelvis just anterior to the urinary bladder axial image 64. There is no free abdominal air. Musculoskeletal: Postsurgical changes post hernia repair noted umbilical region. Sagittal images of the spine shows no destructive bony lesions. Coronal images shows unremarkable bilateral hip joints. IMPRESSION: 1. Mild dilated small bowel loops are noted in mid lower abdomen and pelvis with some air-fluid levels. There is focal mild dilatation of small bowel in axial image 68 in left lower pelvis with fecal like material. Measures about 3.6 cm in diameter. Beyond this point the small bowel is collapsed small caliber. Findings are highly suspicious for small bowel obstruction.  No any contrast material noted within distal small bowel or within colon. 2. No pericecal inflammation.  Normal appendix. 3. Tiny amount of mesenteric free fluid noted in mid lower pelvis axial image 64. No free abdominal air. 4. Status post hysterectomy. 5. Postsurgical changes post hernia repair umbilical region Electronically Signed   By: Natasha MeadLiviu  Pop M.D.   On:  11/28/2015 08:24   I have personally reviewed and evaluated these images and lab results as part of my medical decision-making.   EKG Interpretation None      MDM   Final diagnoses:  SBO (small bowel obstruction) (HCC)    CT with evidence of small bowel obstruction. Discussed with Dr. Sid FalconGerkins. [facility].    Loren Raceravid Imo Cumbie, MD 11/28/15 [phone]

## 2015-11-28 NOTE — ED Notes (Signed)
Report called to [address] [place] RN Kirsten.

## 2015-11-28 NOTE — ED Notes (Signed)
Bed Ready per Nurse on Floor - contacted [facility]

## 2015-11-29 ENCOUNTER — Inpatient Hospital Stay (HOSPITAL_COMMUNITY): Payer: 59

## 2015-11-29 LAB — BASIC METABOLIC PANEL
Anion gap: 7 (ref 5–15)
BUN: 11 mg/dL (ref 6–20)
CHLORIDE: 104 mmol/L (ref 101–111)
CO2: 24 mmol/L (ref 22–32)
Calcium: 8.7 mg/dL — ABNORMAL LOW (ref 8.9–10.3)
Creatinine, Ser: 0.97 mg/dL (ref 0.44–1.00)
GFR calc non Af Amer: >60 mL/min (ref >60)
Glucose, Bld: 103 mg/dL — ABNORMAL HIGH (ref 65–99)
POTASSIUM: 4.4 mmol/L (ref 3.5–5.1)
SODIUM: 135 mmol/L (ref 135–145)

## 2015-11-29 LAB — CBC
HEMATOCRIT: 34.4 % — AB (ref 36.0–46.0)
Hemoglobin: 11.1 g/dL — ABNORMAL LOW (ref 12.0–15.0)
MCH: 29.5 pg (ref 26.0–34.0)
MCHC: 32.3 g/dL (ref 30.0–36.0)
MCV: 91.5 fL (ref 78.0–100.0)
Platelets: 334 10*3/uL (ref 150–400)
RBC: 3.76 MIL/uL — AB (ref 3.87–5.11)
RDW: 13 % (ref 11.5–15.5)
WBC: 5.6 10*3/uL (ref 4.0–10.5)

## 2015-11-29 MED ORDER — ACETAMINOPHEN 325 MG PO TABS
650.0000 mg | ORAL_TABLET | Freq: Four times a day (QID) | ORAL | Status: DC | PRN
  Administered 2015-11-29: 650 mg via ORAL

## 2015-11-29 MED ORDER — KCL IN DEXTROSE-NACL 20-5-0.45 MEQ/L-%-% IV SOLN
INTRAVENOUS | Status: DC
  Administered 2015-11-29 – 2015-11-30 (×2): via INTRAVENOUS
  Filled 2015-11-29 (×4): qty 1000

## 2015-11-29 NOTE — Progress Notes (Signed)
Patient ID: Renee Murphy, female   DOB: July 18, 1971, 45 y.o.   MRN: [id number]  General Surgery - [facility]  HD#: 2  Subjective: Patient in bed, pleasant, no pain since 2 AM.  Husband at bedside.  No nausea, passing flatus.  No BM.  Objective: Vital signs in last 24 hours: Temp:  [97.2 F (36.2 C)-98.1 F (36.7 C)] 97.6 F (36.4 C) (03/21 0606) Pulse Rate:  [59-74] 63 (03/21 0606) Resp:  [18] 18 (03/21 0606) BP: (115-143)/(65-85) 119/65 mmHg (03/21 0606) SpO2:  [96 %-100 %] 100 % (03/21 0606) Last BM Date: 11/26/15  Intake/Output from previous day:   Intake/Output this shift:    Physical Exam: HEENT - sclerae clear, mucous membranes moist Neck - soft Chest - clear bilaterally Cor - RRR Abdomen - soft without distension, mild tenderness RLQ, no guarding, no mass; BS present Ext - no edema, non-tender Neuro - alert & oriented, no focal deficits  Lab Results:   Recent Labs  11/28/15 1626 11/29/15 0552  WBC 6.3 5.6  HGB 11.5* 11.1*  HCT 33.9* 34.4*  PLT 317 334   BMET  Recent Labs  11/28/15 0643 11/28/15 1626 11/29/15 0552  NA 137  --  135  K 4.2  --  4.4  CL 108  --  104  CO2 22  --  24  GLUCOSE 113*  --  103*  BUN 15  --  11  CREATININE 1.02* 0.96 0.97  CALCIUM 9.0  --  8.7*   PT/INR No results for input(s): LABPROT, INR in the last 72 hours. Comprehensive Metabolic Panel:    Component Value Date/Time   NA 135 11/29/2015 0552   NA 137 11/28/2015 0643   K 4.4 11/29/2015 0552   K 4.2 11/28/2015 0643   CL 104 11/29/2015 0552   CL 108 11/28/2015 0643   CO2 24 11/29/2015 0552   CO2 22 11/28/2015 0643   BUN 11 11/29/2015 0552   BUN 15 11/28/2015 0643   CREATININE 0.97 11/29/2015 0552   CREATININE 0.96 11/28/2015 1626   GLUCOSE 103* 11/29/2015 0552   GLUCOSE 113* 11/28/2015 0643   CALCIUM 8.7* 11/29/2015 0552   CALCIUM 9.0 11/28/2015 0643   AST 19 11/28/2015 0643   AST 19 09/20/2015 0920   ALT 15 11/28/2015 0643    ALT 12* 09/20/2015 0920   ALKPHOS 48 11/28/2015 0643   ALKPHOS 59 09/20/2015 0920   BILITOT 0.6 11/28/2015 0643   BILITOT 0.6 09/20/2015 0920   PROT 7.0 11/28/2015 0643   PROT 7.7 09/20/2015 0920   ALBUMIN 3.9 11/28/2015 0643   ALBUMIN 4.3 09/20/2015 0920    Studies/Results: Ct Abdomen Pelvis W Contrast  11/28/2015  CLINICAL DATA:  Left lower quadrant pain starting today 1 a.m., history of bowel obstruction, post hysterectomy who EXAM: CT ABDOMEN AND PELVIS WITH CONTRAST TECHNIQUE: Multidetector CT imaging of the abdomen and pelvis was performed using the standard protocol following bolus administration of intravenous contrast. CONTRAST:  25mL OMNIPAQUE IOHEXOL 300 MG/ML SOLN, OMNIPAQUE IOHEXOL 300 MG/ML SOLN COMPARISON:  09/20/2015 FINDINGS: Lower chest:  Mild dependent atelectasis noted posteriorly. Hepatobiliary: Subcentimeter scattered hepatic cysts are stable. No intrahepatic biliary ductal dilatation. No calcified gallstones are noted within gallbladder. Pancreas: No focal mass or inflammatory changes Spleen: Normal in size without focal abnormality. Adrenals/Urinary Tract: Adrenal glands are unremarkable. Kidneys shows symmetrical enhancement. Lobulated contour of the left kidney again noted. No hydronephrosis or hydroureter. Delayed renal images shows bilateral renal symmetrical excretion. Bilateral visualized proximal  ureter is unremarkable. The urinary bladder is unremarkable. Stomach/Bowel: There is no gastric outlet obstruction. Contrast material noted within stomach. Some contrast material noted within proximal small bowel loops. There are mild distended small bowel loops in mid lower abdomen and pelvis with some air-fluid levels. There is focal dilatation of small bowel with fecal like material axial image 68 in left lower pelvis just anterior to the urinary bladder. Beyond this point the small bowel is small caliber decompressed. Findings are highly suspicious for small bowel  obstruction. No any contrast material noted within distal small bowel or within colon. No pericecal inflammation. Normal appendix is noted in axial image 59. There is some colonic stool in right colon transverse colon and proximal descending colon. Some colonic stool noted in distal sigmoid colon and rectum. Vascular/Lymphatic: No mesenteric adenopathy. Reproductive: The patient is status post hysterectomy. No pelvic adenopathy. Other: Small amount of mesenteric free fluid noted within central pelvis just anterior to the urinary bladder axial image 64. There is no free abdominal air. Musculoskeletal: Postsurgical changes post hernia repair noted umbilical region. Sagittal images of the spine shows no destructive bony lesions. Coronal images shows unremarkable bilateral hip joints. IMPRESSION: 1. Mild dilated small bowel loops are noted in mid lower abdomen and pelvis with some air-fluid levels. There is focal mild dilatation of small bowel in axial image 68 in left lower pelvis with fecal like material. Measures about 3.6 cm in diameter. Beyond this point the small bowel is collapsed small caliber. Findings are highly suspicious for small bowel obstruction. No any contrast material noted within distal small bowel or within colon. 2. No pericecal inflammation.  Normal appendix. 3. Tiny amount of mesenteric free fluid noted in mid lower pelvis axial image 64. No free abdominal air. 4. Status post hysterectomy. 5. Postsurgical changes post hernia repair umbilical region Electronically Signed   By: Natasha MeadLiviu  Pop M.D.   On: 11/28/2015 08:24   Dg Abd 2 Views  11/29/2015  CLINICAL DATA:  Prior history of small bowel obstruction and multiple abdominal surgeries. Previous ventral hernia repair with mesh. Current bloating and abdominal pain. EXAM: ABDOMEN - 2 VIEW COMPARISON:  CT 11/28/2015 and abdominal films 03/28/2015 FINDINGS: Contrast air and stool present throughout the colon. Contrast is present within a few small  bowel loops over the pelvis upper limits of normal in diameter to slightly dilated. No evidence of free peritoneal air or air-fluid levels. Few surgical clips over the pelvis likely from previous tubal ligation. Remainder of the exam is unremarkable. IMPRESSION: Several contrast filled small bowel loops over the pelvis at the upper limits of normal in diameter to slightly dilated. Contrast and air throughout the colon. Findings without significant change from the recent CT scan and may be due to early/ partial small bowel obstruction. Electronically Signed   By: Elberta Fortisaniel  Boyle M.D.   On: 11/29/2015 08:58    Assessment & Plans: Small bowel obstruction, recurrent  AXR with contrast into colon, some small bowel dilatation persists  Begin clear liquid diet  Ambulate in halls  May need to consider semi-elective laparoscopy/laparotomy for lysis of adhesions due to recurrent SBO (3 episodes requiring hospitalization since 2016)  Velora Hecklerodd M. Ashar Lewinski, MD, [facility] Office: [phone]   Arlita Buffkin [name] 11/29/2015

## 2015-11-30 MED ORDER — AZITHROMYCIN 250 MG PO TABS
250.0000 mg | ORAL_TABLET | Freq: Every day | ORAL | Status: DC
  Administered 2015-11-30 – 2015-12-01 (×2): 250 mg via ORAL
  Filled 2015-11-30 (×2): qty 1

## 2015-11-30 MED ORDER — BENZONATATE 100 MG PO CAPS: 200.0000 mg | ORAL_CAPSULE | Freq: Three times a day (TID) | ORAL | Status: DC | PRN

## 2015-11-30 MED ORDER — PANTOPRAZOLE SODIUM 40 MG PO TBEC
40.0000 mg | DELAYED_RELEASE_TABLET | Freq: Every day | ORAL | Status: DC
  Filled 2015-11-30 (×2): qty 1

## 2015-11-30 NOTE — Progress Notes (Signed)
  Subjective: Tolerating clears, no abdominal pain, some flatus, no BM.  Worried about cough she was on a Z pack before admit for bronchitis  Objective: Vital signs in last 24 hours: Temp:  [98 F (36.7 C)-98.6 F (37 C)] 98 F (36.7 C) (03/22 0600) Pulse Rate:  [64-72] 64 (03/22 0600) Resp:  [18] 18 (03/22 0600) BP: (121-126)/(62-76) 124/70 mmHg (03/22 0600) SpO2:  [99 %-100 %] 100 % (03/22 0600) Last BM Date: 11/26/15 I/O not very useful  Diet:  clears Afebrile, VSS No labs  Intake/Output from previous day: 03/21 0701 - 03/22 0700 In: 1010 [I.V.:1010] Out: -  Intake/Output this shift:    General appearance: alert, cooperative and no distress Resp: clear to auscultation bilaterally and cough that appears non productive.   GI: soft, non-tender; bowel sounds normal; no masses,  no organomegaly  Lab Results:   Recent Labs  11/28/15 1626 11/29/15 0552  WBC 6.3 5.6  HGB 11.5* 11.1*  HCT 33.9* 34.4*  PLT 317 334    BMET  Recent Labs  11/28/15 0643 11/28/15 1626 11/29/15 0552  NA 137  --  135  K 4.2  --  4.4  CL 108  --  104  CO2 22  --  24  GLUCOSE 113*  --  103*  BUN 15  --  11  CREATININE 1.02* 0.96 0.97  CALCIUM 9.0  --  8.7*   PT/INR No results for input(s): LABPROT, INR in the last 72 hours.   Recent Labs Lab 11/28/15 0643  AST 19  ALT 15  ALKPHOS 48  BILITOT 0.6  PROT 7.0  ALBUMIN 3.9     Lipase     Component Value Date/Time   LIPASE 22 11/28/2015 [id number]     Studies/Results: Dg Abd 2 Views  11/29/2015  CLINICAL DATA:  Prior history of small bowel obstruction and multiple abdominal surgeries. Previous ventral hernia repair with mesh. Current bloating and abdominal pain. EXAM: ABDOMEN - 2 VIEW COMPARISON:  CT 11/28/2015 and abdominal films 03/28/2015 FINDINGS: Contrast air and stool present throughout the colon. Contrast is present within a few small bowel loops over the pelvis upper limits of normal in diameter to slightly dilated. No  evidence of free peritoneal air or air-fluid levels. Few surgical clips over the pelvis likely from previous tubal ligation. Remainder of the exam is unremarkable. IMPRESSION: Several contrast filled small bowel loops over the pelvis at the upper limits of normal in diameter to slightly dilated. Contrast and air throughout the colon. Findings without significant change from the recent CT scan and may be due to early/ partial small bowel obstruction. Electronically Signed   By: Elberta Fortisaniel  Boyle M.D.   On: 11/29/2015 08:58    Medications: . chlorhexidine  15 mL Mouth/Throat BID  . heparin  5,000 Units Subcutaneous 3 times per day  . pantoprazole (PROTONIX) IV  40 mg Intravenous QHS    Assessment/Plan Recurrent SBO Prior hx of hysterectomy with ureteral resection/ureteral repair and reimplantation 2011 & 2012 Multiple SBO's  Hernia repair with Mesh 2016 [place] also Antibiotics:  None DVT:  Heparin/SCD   Plan:  IV has Infiltrated, I will hold off on starting IV again.  Mobilize, and put up to full liquids.  Resume azithromycin.       LOS: 2 days    Luka Reisch 11/30/2015

## 2015-12-01 NOTE — Progress Notes (Signed)
Discharge instructions given to pt, verbalized understanding. Left the unit in stable condition. 

## 2015-12-01 NOTE — Discharge Summary (Signed)
Physician Discharge Summary [facility]  Patient ID: Renee Murphy MRN: [id number] DOB/AGE: [birth year] 45 y.o.  Admit date: 11/28/2015 Discharge date: 12/01/2015  Admission Diagnoses:  Small bowel obstruction  Discharge Diagnoses:  Active Problems:   SBO (small bowel obstruction) (HCC)   Small bowel obstruction (HCC)   Discharged Condition: good  Hospital Course: patient admitted with signs and symptoms of small bowel obstruction.  Improved with bowel rest, IV hydration.  NG tube not required.  Improved with improving radiographic picture.  Diet advanced slowly and tolerated.  Prepared for discharge 12/01/2015.  Consults: None  Treatments: IV hydration  Discharge Exam: Blood pressure 109/74, pulse 70, temperature 98.2 F (36.8 C), temperature source Oral, resp. rate 16, height 5' 9.5" (1.765 m), weight 79.379 kg (175 lb), SpO2 100 %. See progress note today.  Disposition: Home  Discharge Instructions    Diet - low sodium heart healthy    Complete by:  As directed      Discharge instructions    Complete by:  As directed   [facility] -- DISCHARGE INSTRUCTIONS  REMINDER:   Carry a list of your medications and allergies with you at all times  Call your pharmacy at least 1 week in advance to refill prescriptions  Do not mix any prescribed pain medicine with alcohol  Do not drive any motor vehicles while taking pain medication  Take medications with food unless otherwise directed  Follow-up appointments (date to return to physician): Please call [phone] to confirm your follow up appointment with your surgeon.  Call your Surgeon if you have:  Temperature greater than 101.0  Persistent nausea and vomiting  Severe uncontrolled pain  Redness, tenderness, or signs of infection (pain, swelling, redness, odor or    green/yellow discharge around the site)  Difficulty breathing, headache or visual disturbances  Hives  Persistent  dizziness or light-headedness  Any other questions or concerns you may have after discharge  In an emergency, call 911 or go to an Emergency Department at a nearby hospital.   Diet: Begin with liquids, and if they are tolerated, resume your usual diet.  Avoid spicy, greasy or heavy foods.  If you have nausea or vomiting, go back to liquids.  If you cannot keep liquids down, call your doctor.  Avoid alcohol consumption while on prescription pain medications. Good nutrition promotes healing. Increase fiber and fluids.   ADDITIONAL INSTRUCTIONS: Will arrange consult at [facility] office with Dr. Axel Filler to discuss laparoscopy and lysis of adhesions semi-electively.  Velora Heckler, MD, [facility] Office: [phone]     Increase activity slowly    Complete by:  As directed      No wound care    Complete by:  As directed             Medication List    STOP taking these medications        HYDROcodone-acetaminophen 5-325 MG tablet  Commonly known as:  NORCO/VICODIN     ondansetron 4 MG tablet  Commonly known as:  ZOFRAN      TAKE these medications        azithromycin 250 MG tablet  Commonly known as:  ZITHROMAX  Take 250 mg by mouth daily.     benzonatate 200 MG capsule  Commonly known as:  TESSALON  Take 200 mg by mouth 3 (three) times daily as needed for cough.     chlorpheniramine-HYDROcodone 10-8 MG/5ML Suer  Commonly known as:  TUSSIONEX  Take 5 mLs by mouth every 12 (twelve) hours as needed. Cough for up to 7 days.     docusate sodium 100 MG capsule  Commonly known as:  COLACE  Take 1 capsule (100 mg total) by mouth every 12 (twelve) hours.     HALLS COUGH DROPS MT  Use as directed in the mouth or throat as needed (cough.).           Follow-up Information    Follow up with Lajean Saveramirez Jr., Armando, MD. Schedule an appointment as soon as possible for a visit in 2 weeks.   Specialty:  General Surgery   Why:  to discuss laparoscopic  surgery for recurrent bowel obstructions   Contact information:   [address] [place] [address] [phone]       Velora Hecklerodd M. Kentley Cedillo, MD, [facility] Office: [phone]   Signed: Velora HecklerGERKIN,Mertie Haslem M 12/01/2015, 11:07 AM

## 2015-12-01 NOTE — Progress Notes (Signed)
Patient ID: Renee Murphy, female   DOB: 04/22/1971, 45 y.o.   MRN: [id number]  General Surgery [facility]  Subjective: Patient comfortable in bed, husband at bedside.  Patient fully dressed and wants to go home.  No nausea or emesis.  Tolerated full liquid diet.  Passing flatus, no BM yet.  Denies pain.  Objective: Vital signs in last 24 hours: Temp:  [98.2 F (36.8 C)-98.8 F (37.1 C)] 98.2 F (36.8 C) (03/23 0836) Pulse Rate:  [70-86] 70 (03/23 0836) Resp:  [16-19] 16 (03/23 0836) BP: (101-110)/(61-84) 109/74 mmHg (03/23 0836) SpO2:  [96 %-100 %] 100 % (03/23 0836) Last BM Date: 12/01/15  Intake/Output from previous day: 03/22 0701 - 03/23 0700 In: 240 [P.O.:240] Out: -  Intake/Output this shift:    Physical Exam: HEENT - sclerae clear, mucous membranes moist Abdomen - soft without distension; BS present; no mass; no tenderness Ext - no edema, non-tender Neuro - alert & oriented, no focal deficits  Lab Results:   Recent Labs  11/28/15 1626 11/29/15 0552  WBC 6.3 5.6  HGB 11.5* 11.1*  HCT 33.9* 34.4*  PLT 317 334   BMET  Recent Labs  11/28/15 1626 11/29/15 0552  NA  --  135  K  --  4.4  CL  --  104  CO2  --  24  GLUCOSE  --  103*  BUN  --  11  CREATININE 0.96 0.97  CALCIUM  --  8.7*   PT/INR No results for input(s): LABPROT, INR in the last 72 hours. Comprehensive Metabolic Panel:    Component Value Date/Time   NA 135 11/29/2015 0552   NA 137 11/28/2015 0643   K 4.4 11/29/2015 0552   K 4.2 11/28/2015 0643   CL 104 11/29/2015 0552   CL 108 11/28/2015 0643   CO2 24 11/29/2015 0552   CO2 22 11/28/2015 0643   BUN 11 11/29/2015 0552   BUN 15 11/28/2015 0643   CREATININE 0.97 11/29/2015 0552   CREATININE 0.96 11/28/2015 1626   GLUCOSE 103* 11/29/2015 0552   GLUCOSE 113* 11/28/2015 0643   CALCIUM 8.7* 11/29/2015 0552   CALCIUM 9.0 11/28/2015 0643   AST 19 11/28/2015 0643   AST 19 09/20/2015 0920   ALT 15 11/28/2015  0643   ALT 12* 09/20/2015 0920   ALKPHOS 48 11/28/2015 0643   ALKPHOS 59 09/20/2015 0920   BILITOT 0.6 11/28/2015 0643   BILITOT 0.6 09/20/2015 0920   PROT 7.0 11/28/2015 0643   PROT 7.7 09/20/2015 0920   ALBUMIN 3.9 11/28/2015 0643   ALBUMIN 4.3 09/20/2015 0920    Studies/Results: No results found.  Assessment & Plans: Small bowel obstruction, recurrent - clinically resolved  Discussed options for management with patient and husband  Will discharge home today on light diet  If recurrent symptoms, return to [facility] for admission and surgery  Otherwise, will arrange follow up at [facility] office to discuss laparoscopy and lysis of adhesions with Dr. Derrell Lollingamirez (I have discussed patient with him)  Discharge orders written  Velora Hecklerodd M. Tanetta Fuhriman, MD, [facility] Office: [phone]   [name] [name] 12/01/2015

## 2016-09-25 ENCOUNTER — Emergency Department (HOSPITAL_BASED_OUTPATIENT_CLINIC_OR_DEPARTMENT_OTHER): Payer: Managed Care, Other (non HMO)

## 2016-09-25 ENCOUNTER — Encounter (HOSPITAL_BASED_OUTPATIENT_CLINIC_OR_DEPARTMENT_OTHER): Payer: Self-pay | Admitting: Emergency Medicine

## 2016-09-25 ENCOUNTER — Emergency Department (HOSPITAL_BASED_OUTPATIENT_CLINIC_OR_DEPARTMENT_OTHER)
Admission: EM | Admit: 2016-09-25 | Discharge: 2016-09-25 | Disposition: A | Discharge status: Home / Self Care | Payer: Managed Care, Other (non HMO) | Attending: Emergency Medicine | Admitting: Emergency Medicine

## 2016-09-25 DIAGNOSIS — R197 Diarrhea, unspecified: Secondary | ICD-10-CM | POA: Insufficient documentation

## 2016-09-25 DIAGNOSIS — R1084 Generalized abdominal pain: Secondary | ICD-10-CM | POA: Diagnosis present

## 2016-09-25 DIAGNOSIS — R112 Nausea with vomiting, unspecified: Secondary | ICD-10-CM | POA: Insufficient documentation

## 2016-09-25 HISTORY — DX: Other chronic pain: G89.29

## 2016-09-25 HISTORY — DX: Pain in left ankle and joints of left foot: M25.572

## 2016-09-25 HISTORY — DX: Intestinal adhesions (bands), unspecified as to partial versus complete obstruction: K56.50

## 2016-09-25 LAB — CBC WITH DIFFERENTIAL/PLATELET
Basophils Absolute: 0 10*3/uL (ref 0.0–0.1)
Basophils Relative: 0 %
Eosinophils Absolute: 0 10*3/uL (ref 0.0–0.7)
Eosinophils Relative: 0 %
HCT: 34.7 % — ABNORMAL LOW (ref 36.0–46.0)
HEMOGLOBIN: 11.7 g/dL — AB (ref 12.0–15.0)
Lymphocytes Relative: 7 %
Lymphs Abs: 0.9 10*3/uL (ref 0.7–4.0)
MCH: 30.3 pg (ref 26.0–34.0)
MCHC: 33.7 g/dL (ref 30.0–36.0)
MCV: 89.9 fL (ref 78.0–100.0)
MONOS PCT: 5 %
Monocytes Absolute: 0.5 10*3/uL (ref 0.1–1.0)
NEUTROS PCT: 88 %
Neutro Abs: 10.6 10*3/uL — ABNORMAL HIGH (ref 1.7–7.7)
Platelets: 262 10*3/uL (ref 150–400)
RBC: 3.86 MIL/uL — ABNORMAL LOW (ref 3.87–5.11)
RDW: 12.2 % (ref 11.5–15.5)
WBC: 12 10*3/uL — ABNORMAL HIGH (ref 4.0–10.5)

## 2016-09-25 LAB — COMPREHENSIVE METABOLIC PANEL
ALBUMIN: 4.3 g/dL (ref 3.5–5.0)
ALK PHOS: 51 U/L (ref 38–126)
ALT: 13 U/L — ABNORMAL LOW (ref 14–54)
ANION GAP: 8 (ref 5–15)
AST: 25 U/L (ref 15–41)
BUN: 21 mg/dL — AB (ref 6–20)
CALCIUM: 9.2 mg/dL (ref 8.9–10.3)
CO2: 25 mmol/L (ref 22–32)
Chloride: 102 mmol/L (ref 101–111)
Creatinine, Ser: 1.16 mg/dL — ABNORMAL HIGH (ref 0.44–1.00)
GFR calc Af Amer: >60 mL/min (ref >60)
GFR calc non Af Amer: 56 mL/min — ABNORMAL LOW (ref >60)
GLUCOSE: 119 mg/dL — AB (ref 65–99)
Potassium: 3.9 mmol/L (ref 3.5–5.1)
SODIUM: 135 mmol/L (ref 135–145)
Total Bilirubin: 0.4 mg/dL (ref 0.3–1.2)
Total Protein: 7.7 g/dL (ref 6.5–8.1)

## 2016-09-25 LAB — URINALYSIS, ROUTINE W REFLEX MICROSCOPIC
Bilirubin Urine: NEGATIVE
GLUCOSE, UA: NEGATIVE mg/dL
Hgb urine dipstick: NEGATIVE
KETONES UR: NEGATIVE mg/dL
LEUKOCYTES UA: NEGATIVE
Nitrite: NEGATIVE
PH: 6.5 (ref 5.0–8.0)
Protein, ur: NEGATIVE mg/dL
Specific Gravity, Urine: 1.007 (ref 1.005–1.030)

## 2016-09-25 LAB — LIPASE, BLOOD: Lipase: 24 U/L (ref 11–51)

## 2016-09-25 MED ORDER — ONDANSETRON HCL 4 MG/2ML IJ SOLN
4.0000 mg | Freq: Once | INTRAMUSCULAR | Status: AC
  Administered 2016-09-25: 4 mg via INTRAVENOUS
  Filled 2016-09-25: qty 2

## 2016-09-25 MED ORDER — PROMETHAZINE HCL 25 MG/ML IJ SOLN
25.0000 mg | Freq: Once | INTRAMUSCULAR | Status: AC
Start: 2016-09-25 — End: 2016-09-25
  Administered 2016-09-25: 25 mg via INTRAVENOUS
  Filled 2016-09-25: qty 1

## 2016-09-25 MED ORDER — MORPHINE SULFATE (PF) 4 MG/ML IV SOLN
4.0000 mg | Freq: Once | INTRAVENOUS | Status: AC
  Administered 2016-09-25: 4 mg via INTRAVENOUS
  Filled 2016-09-25: qty 1

## 2016-09-25 MED ORDER — ONDANSETRON 4 MG PO TBDP: 4.0000 mg | ORAL_TABLET | Freq: Three times a day (TID) | ORAL | 0 refills | Status: DC | PRN

## 2016-09-25 MED ORDER — SODIUM CHLORIDE 0.9 % IV BOLUS (SEPSIS)
1000.0000 mL | Freq: Once | INTRAVENOUS | Status: AC
  Administered 2016-09-25: 1000 mL via INTRAVENOUS

## 2016-09-25 NOTE — ED Notes (Signed)
ED Provider at bedside. 

## 2016-09-25 NOTE — ED Notes (Signed)
Pt given gingerale for PO challenge 

## 2016-09-25 NOTE — ED Provider Notes (Signed)
[facility] Note   CSN: [id number] Arrival date & time: 09/25/16  0038  By signing my name below, I, Octavia Heir, attest that this documentation has been prepared under the direction and in the presence of Jacalyn Lefevre, MD.  Electronically Signed: Octavia Heir, ED Scribe. 09/25/16. 12:54 AM.    History   Chief Complaint Chief Complaint  Patient presents with  . Abdominal Pain    The history is provided by the patient. No language interpreter was used.   HPI Comments: Renee Murphy is a 46 y.o. female who has a PMhx of SBO presents to the Emergency Department complaining of diffuse abdominal pain with it being gradually worse in the suprapubic area that began ~ 4 hours ago. Pt reports associated nausea, vomiting, and diarrhea. Pt has a hx of SBO and expresses that her current pain feels very similar to prior episodes. Pt has not been around any known sick contacts. Pt has an abdominal surgical hx of hysterectomy and hernia repair. Pt denies leg pain.  Past Medical History:  Diagnosis Date  . Bowel obstruction   . Chronic pain of left ankle   . Intestinal adhesions with obstruction     Patient Active Problem List   Diagnosis Date Noted  . Small bowel obstruction 11/28/2015  . SBO (small bowel obstruction) 03/26/2015    Past Surgical History:  Procedure Laterality Date  . ABDOMINAL HYSTERECTOMY    . HERNIA REPAIR    . URETHRA SURGERY      OB History    No data available       Home Medications    Prior to Admission medications   Medication Sig Start Date End Date Taking? Authorizing Provider  azithromycin (ZITHROMAX) 250 MG tablet Take 250 mg by mouth daily. 11/26/15   Historical Provider, MD  benzonatate (TESSALON) 200 MG capsule Take 200 mg by mouth 3 (three) times daily as needed for cough.    Historical Provider, MD  docusate sodium (COLACE) 100 MG capsule Take 1 capsule (100 mg total) by mouth every 12 (twelve) hours. Patient  not taking: Reported on 11/28/2015 09/20/15   Arby Barrette, MD  Menthol (HALLS COUGH DROPS MT) Use as directed in the mouth or throat as needed (cough.).    Historical Provider, MD  ondansetron (ZOFRAN ODT) 4 MG disintegrating tablet Take 1 tablet (4 mg total) by mouth every 8 (eight) hours as needed for nausea or vomiting. 09/25/16   Jacalyn Lefevre, MD    Family History History reviewed. No pertinent family history.  Social History Social History  Substance Use Topics  . Smoking status: Never Smoker  . Smokeless tobacco: Never Used  . Alcohol use No     Allergies   Aspirin   Review of Systems Review of Systems  A complete 10 system review of systems was obtained and all systems are negative except as noted in the HPI and PMH.   Physical Exam Updated Vital Signs BP 125/88 (BP Location: Right Arm)   Pulse 90   Temp 98.4 F (36.9 C) (Oral)   Resp 18   Ht 5' 9.5" (1.765 m)   Wt 170 lb (77.1 kg)   SpO2 100%   BMI 24.74 kg/m   Physical Exam  Constitutional: She is oriented to person, place, and time. She appears well-developed and well-nourished. No distress.  HENT:  Head: Normocephalic and atraumatic.  Eyes: EOM are normal.  Neck: Normal range of motion.  Cardiovascular: Normal rate, regular rhythm and normal heart  sounds.   Pulmonary/Chest: Effort normal and breath sounds normal.  Abdominal: Soft. She exhibits no distension. There is tenderness.  Diffuse abdominal tenderness  Musculoskeletal: Normal range of motion.  Neurological: She is alert and oriented to person, place, and time.  Skin: Skin is warm and dry.  Psychiatric: She has a normal mood and affect. Judgment normal.  Nursing note and vitals reviewed.    ED Treatments / Results  DIAGNOSTIC STUDIES: Oxygen Saturation is 100% on RA, normal by my interpretation.  COORDINATION OF CARE:  12:42 AM Discussed treatment plan with pt at bedside and pt agreed to plan.  Labs (all labs ordered are listed, but  only abnormal results are displayed) Labs Reviewed  COMPREHENSIVE METABOLIC PANEL - Abnormal; Notable for the following:       Result Value   Glucose, Bld 119 (*)    BUN 21 (*)    Creatinine, Ser 1.16 (*)    ALT 13 (*)    GFR calc non Af Amer 56 (*)    All other components within normal limits  CBC WITH DIFFERENTIAL/PLATELET - Abnormal; Notable for the following:    WBC 12.0 (*)    RBC 3.86 (*)    Hemoglobin 11.7 (*)    HCT 34.7 (*)    Neutro Abs 10.6 (*)    All other components within normal limits  URINALYSIS, ROUTINE W REFLEX MICROSCOPIC  LIPASE, BLOOD  CBC WITH DIFFERENTIAL/PLATELET    EKG  EKG Interpretation None       Radiology Dg Abdomen Acute W/chest  Result Date: 09/25/2016 CLINICAL DATA:  Intermittent palpitations and abdominal pain with nausea vomiting and diarrhea EXAM: DG ABDOMEN ACUTE W/ 1V CHEST COMPARISON:  11/29/2015 FINDINGS: Single-view chest demonstrates no acute infiltrate or effusion. Normal heart size. Supine and upright views of the abdomen demonstrate no free air beneath the diaphragm. Diffuse decreased bowel gas. No abnormal calcifications. Surgical clips in the pelvis. IMPRESSION: 1. Single-view chest within normal limits 2. Diffuse decreased bowel gas. Electronically Signed   By: Jasmine PangKim  Fujinaga M.D.   On: 09/25/2016 01:35    Procedures Procedures (including critical care time)  Medications Ordered in ED Medications  sodium chloride 0.9 % bolus 1,000 mL (0 mLs Intravenous Stopped 09/25/16 0208)  morphine 4 MG/ML injection 4 mg (4 mg Intravenous Given 09/25/16 0106)  ondansetron (ZOFRAN) injection 4 mg (4 mg Intravenous Given 09/25/16 0106)  sodium chloride 0.9 % bolus 1,000 mL (0 mLs Intravenous Stopped 09/25/16 0314)  promethazine (PHENERGAN) injection 25 mg (25 mg Intravenous Given 09/25/16 0300)     Initial Impression / Assessment and Plan / ED Course  I have reviewed the triage vital signs and the nursing notes.  Pertinent labs & imaging  results that were available during my care of the patient were reviewed by me and considered in my medical decision making (see chart for details).  Clinical Course    Pt feels much better after 2 L, zofran and phenergan.  She wants to go home.  She will be d/c with zofran odt and encouraged to return if worse.   Final Clinical Impressions(s) / ED Diagnoses   Final diagnoses:  Nausea vomiting and diarrhea    New Prescriptions New Prescriptions   ONDANSETRON (ZOFRAN ODT) 4 MG DISINTEGRATING TABLET    Take 1 tablet (4 mg total) by mouth every 8 (eight) hours as needed for nausea or vomiting.  I personally performed the services described in this documentation, which was scribed in my presence. The recorded  information has been reviewed and is accurate.    Jacalyn Lefevre, MD 09/25/16 [phone]

## 2016-09-25 NOTE — ED Triage Notes (Signed)
Patient reports that she is having pain to her bilateral pelvic region. Report that she is now having N/V/D

## 2016-09-25 NOTE — ED Notes (Signed)
Pt reports nausea after drinking ginger ale. MD made aware.

## 2016-09-25 NOTE — ED Notes (Addendum)
Pt ambulatory to bathroom with assistance from husband. Pt reports dizziness with ambulating. Pt encouraged to sip on ginger ale.

## 2019-10-09 ENCOUNTER — Emergency Department
Admission: EM | Admit: 2019-10-09 | Discharge: 2019-10-09 | Disposition: A | Discharge status: Home / Self Care | Payer: 59 | Source: Home / Self Care | Attending: Family Medicine | Admitting: Family Medicine

## 2019-10-09 ENCOUNTER — Emergency Department (INDEPENDENT_AMBULATORY_CARE_PROVIDER_SITE_OTHER): Payer: 59

## 2019-10-09 ENCOUNTER — Other Ambulatory Visit: Payer: Self-pay

## 2019-10-09 ENCOUNTER — Encounter: Payer: Self-pay | Admitting: Emergency Medicine

## 2019-10-09 ENCOUNTER — Ambulatory Visit (INDEPENDENT_AMBULATORY_CARE_PROVIDER_SITE_OTHER)
Admission: RE | Admit: 2019-10-09 | Discharge: 2019-10-09 | Disposition: A | Discharge status: Other Acute Inpatient Hospital | Payer: Self-pay | Source: Ambulatory Visit

## 2019-10-09 DIAGNOSIS — U071 COVID-19: Secondary | ICD-10-CM

## 2019-10-09 DIAGNOSIS — R05 Cough: Secondary | ICD-10-CM

## 2019-10-09 DIAGNOSIS — R918 Other nonspecific abnormal finding of lung field: Secondary | ICD-10-CM

## 2019-10-09 DIAGNOSIS — R059 Cough, unspecified: Secondary | ICD-10-CM

## 2019-10-09 LAB — VITAMIN D 25 HYDROXY (VIT D DEFICIENCY, FRACTURES): Vit D, 25-Hydroxy: 23 ng/mL — ABNORMAL LOW (ref 30–100)

## 2019-10-09 MED ORDER — AZITHROMYCIN 250 MG PO TABS: ORAL_TABLET | ORAL | 0 refills | Status: DC

## 2019-10-09 MED ORDER — PREDNISONE 20 MG PO TABS: ORAL_TABLET | ORAL | 0 refills | Status: DC

## 2019-10-09 MED ORDER — METHYLPREDNISOLONE SODIUM SUCC 125 MG IJ SOLR
80.0000 mg | Freq: Once | INTRAMUSCULAR | Status: AC
  Administered 2019-10-09: 20:00:00 80 mg via INTRAMUSCULAR

## 2019-10-09 NOTE — ED Triage Notes (Signed)
COVID POSITIVE, Fatigue, cough, nausea, body aches, SOB, headaches, chest hurts, exhausted.

## 2019-10-09 NOTE — ED Provider Notes (Signed)
[facility]    CSN: [id number] Arrival date & time: 10/09/19  1734      History   Chief Complaint Chief Complaint  Patient presents with  . Fatigue    HPI Renee Murphy is a 49 y.o. female.   About a week ago patient developed cough, myalgias, headache, fatigue, and chills/sweats.  She was diagnosed with COVID19.  She lost her taste/smell 6 days ago and has had nausea during the past 5 days.  Today she has had pain in her anterior chest with cough, and notes that her acid reflux has become worse.  The history is provided by the patient.    Past Medical History:  Diagnosis Date  . Bowel obstruction (HCC)   . Chronic pain of left ankle   . Intestinal adhesions with obstruction [facility])     Patient Active Problem List   Diagnosis Date Noted  . Small bowel obstruction (HCC) 11/28/2015  . SBO (small bowel obstruction) (HCC) 03/26/2015    Past Surgical History:  Procedure Laterality Date  . ABDOMINAL HYSTERECTOMY    . HERNIA REPAIR    . URETHRA SURGERY         Home Medications    Prior to Admission medications   Medication Sig Start Date End Date Taking? Authorizing Provider  acetaminophen (TYLENOL) 325 MG tablet Take 650 mg by mouth every 6 (six) hours as needed.   Yes [provider]  famotidine (PEPCID) 10 MG tablet Take 10 mg by mouth 2 (two) times daily.   Yes [provider]  azithromycin (ZITHROMAX Z-PAK) 250 MG tablet Take 2 tabs today; then begin one tab once daily for 4 more days. 10/09/19   Lattie Haw, MD  Menthol (HALLS COUGH DROPS MT) Use as directed in the mouth or throat as needed (cough.).    [provider]  predniSONE (DELTASONE) 20 MG tablet Take one tab by mouth twice daily for 4 days, then one daily for 3 days. Take with food. 10/09/19   Lattie Haw, MD    Family History Family History  Problem Relation Age of Onset  . Diabetes Mother   . Cancer Father     Social History Social History    Tobacco Use  . Smoking status: Never Smoker  . Smokeless tobacco: Never Used  Substance Use Topics  . Alcohol use: No  . Drug use: No     Allergies   Aspirin   Review of Systems Review of Systems No sore throat + cough ? pleuritic pain  No wheezing + nasal congestion + post-nasal drainage No sinus pain/pressure No itchy/red eyes No earache No hemoptysis No SOB No fever, + chills/sweats + nausea + increased reflux symptoms  No vomiting No abdominal pain No diarrhea No urinary symptoms No skin rash + fatigue + myalgias + headache Used OTC meds (Tylenol, Pepcid) without relief   Physical Exam Triage Vital Signs ED Triage Vitals  Enc Vitals Group     BP 10/09/19 1807 127/84     Pulse Rate 10/09/19 1807 86     Resp 10/09/19 1807 18     Temp 10/09/19 1807 98.4 F (36.9 C)     Temp Source 10/09/19 1807 Oral     SpO2 10/09/19 1807 99 %     Weight --      Height --      Head Circumference --      Peak Flow --      Pain Score 10/09/19 1808 0  Pain Loc --      Pain Edu? --      Excl. in GC? --    No data found.  Updated Vital Signs BP 127/84 (BP Location: Right Arm)   Pulse 86   Temp 98.4 F (36.9 C) (Oral)   Resp 18   SpO2 99%   Visual Acuity Right Eye Distance:   Left Eye Distance:   Bilateral Distance:    Right Eye Near:   Left Eye Near:    Bilateral Near:     Physical Exam Nursing notes and Vital Signs reviewed. Appearance:  Patient appears stated age, and in no acute distress Eyes:  Pupils are equal, round, and reactive to light and accomodation.  Extraocular movement is intact.  Conjunctivae are not inflamed  Ears:  Canals normal.  Tympanic membranes normal.  Nose:  Mildly congested turbinates.  No sinus tenderness.  Pharynx:  Normal Neck:  Supple.  Tender, mildly enlarged lateral nodes.  Lungs:   Faint rales right anterior base.  Breath sounds are equal.  Moving air well. Heart:  Regular rate and rhythm without murmurs, rubs, or  gallops.  Abdomen:  Nontender without masses or hepatosplenomegaly.  Bowel sounds are present.  No CVA or flank tenderness.  Extremities:  No edema.  Skin:  No rash present.   UC Treatments / Results  Labs (all labs ordered are listed, but only abnormal results are displayed) Labs Reviewed  VITAMIN D 25 HYDROXY (VIT D DEFICIENCY, FRACTURES)  POCT CBC W AUTO DIFF ([facility]):  WBC 5.5; LY 31.4; MO 5.3; GR 63.3; Hgb 13.1; Platelets 181     EKG   Radiology DG Chest 2 View  Result Date: 10/09/2019 CLINICAL DATA:  Chills, cough and shortness of breath. EXAM: CHEST - 2 VIEW COMPARISON:  None. FINDINGS: A small (approximately 1.3 cm x 0.8 cm) ill-defined area of mildly increased opacification is seen overlying the medial aspect of upper left lung. This projects over the proximal portion of left clavicle. There is no evidence of a pleural effusion or pneumothorax the heart size and mediastinal contours are within normal limits. The visualized skeletal structures are unremarkable. IMPRESSION: 1. Small, ill-defined opacity projecting over the medial aspect of the upper left lung. While this may be osseous in origin, sequelae associated with an underlying abnormality within the lung parenchyma cannot be excluded. Electronically Signed   By: Aram Candela M.D.   On: 10/09/2019 19:16    Procedures Procedures (including critical care time)  Medications Ordered in UC Medications  methylPREDNISolone sodium succinate (SOLU-MEDROL) 125 mg/2 mL injection 80 mg (80 mg Intramuscular Given 10/09/19 2006)    Initial Impression / Assessment and Plan / UC Course  I have reviewed the triage vital signs and the nursing notes.  Pertinent labs & imaging results that were available during my care of the patient were reviewed by me and considered in my medical decision making (see chart for details).    Normal CBC reassuring.  Note abnormal finding on chest x-ray. Administered Solumedrol 80mg  IM,  then begin prednisone burst/taper. Begin empiric Z-pak. Check Vitamin D 25-OH level. Followup with PCP in about one week    Final Clinical Impressions(s) / UC Diagnoses   Final diagnoses:  COVID-19 virus infection     Discharge Instructions     Begin prednisone Saturday 10/10/19. Take plain guaifenesin (1200mg  extended release tabs such as Mucinex) twice daily, with plenty of water, for cough and congestion.  May add Pseudoephedrine (30mg , one or  two every 4 to 6 hours) for sinus congestion.  Get adequate rest.    Also recommend using saline nasal spray several times daily and saline nasal irrigation (AYR is a common brand).   Try warm salt water gargles for sore throat.  May take Delsym Cough Suppressant at bedtime for nighttime cough.  Stop all antihistamines for now, and other non-prescription cough/cold preparations. May take Tylenol as needed for fever, headache, body aches, etc.  If symptoms become significantly worse during the night or over the weekend, proceed to the local emergency room.       ED Prescriptions    Medication Sig Dispense Auth. Provider   predniSONE (DELTASONE) 20 MG tablet Take one tab by mouth twice daily for 4 days, then one daily for 3 days. Take with food. 11 tablet Kandra Nicolas, MD   azithromycin (ZITHROMAX Z-PAK) 250 MG tablet Take 2 tabs today; then begin one tab once daily for 4 more days. 6 tablet Kandra Nicolas, MD        Kandra Nicolas, MD 10/14/19 [phone]

## 2019-10-09 NOTE — Discharge Instructions (Addendum)
Begin prednisone Saturday 10/10/19. Take plain guaifenesin (1200mg  extended release tabs such as Mucinex) twice daily, with plenty of water, for cough and congestion.  May add Pseudoephedrine (30mg , one or two every 4 to 6 hours) for sinus congestion.  Get adequate rest.    Also recommend using saline nasal spray several times daily and saline nasal irrigation (AYR is a common brand).   Try warm salt water gargles for sore throat.  May take Delsym Cough Suppressant at bedtime for nighttime cough.  Stop all antihistamines for now, and other non-prescription cough/cold preparations. May take Tylenol as needed for fever, headache, body aches, etc.  If symptoms become significantly worse during the night or over the weekend, proceed to the local emergency room.

## 2019-10-09 NOTE — ED Provider Notes (Signed)
Patient would like to be seen in person and will come to the Urgent Care.    Mickie Bail, NP 10/09/19 1606

## 2019-10-14 LAB — POCT CBC W AUTO DIFF (K'VILLE URGENT CARE)

## 2020-01-18 ENCOUNTER — Other Ambulatory Visit (HOSPITAL_COMMUNITY)
Admission: RE | Admit: 2020-01-18 | Discharge: 2020-01-18 | Disposition: A | Discharge status: Home / Self Care | Payer: 59 | Source: Ambulatory Visit | Attending: Family Medicine | Admitting: Family Medicine

## 2020-01-18 ENCOUNTER — Emergency Department
Admission: EM | Admit: 2020-01-18 | Discharge: 2020-01-18 | Disposition: A | Discharge status: Home / Self Care | Payer: 59 | Source: Home / Self Care

## 2020-01-18 ENCOUNTER — Other Ambulatory Visit: Payer: Self-pay

## 2020-01-18 ENCOUNTER — Emergency Department (INDEPENDENT_AMBULATORY_CARE_PROVIDER_SITE_OTHER): Payer: 59

## 2020-01-18 DIAGNOSIS — R300 Dysuria: Secondary | ICD-10-CM

## 2020-01-18 DIAGNOSIS — M25561 Pain in right knee: Secondary | ICD-10-CM

## 2020-01-18 DIAGNOSIS — N898 Other specified noninflammatory disorders of vagina: Secondary | ICD-10-CM

## 2020-01-18 DIAGNOSIS — M25461 Effusion, right knee: Secondary | ICD-10-CM

## 2020-01-18 LAB — POCT URINALYSIS DIP (MANUAL ENTRY)
Bilirubin, UA: NEGATIVE
Glucose, UA: NEGATIVE mg/dL
Ketones, POC UA: NEGATIVE mg/dL
Leukocytes, UA: NEGATIVE
Nitrite, UA: NEGATIVE
Protein Ur, POC: 30 mg/dL — AB
Spec Grav, UA: 1.025 (ref 1.010–1.025)
Urobilinogen, UA: 0.2 E.U./dL
pH, UA: 5.5 (ref 5.0–8.0)

## 2020-01-18 MED ORDER — FLUCONAZOLE 150 MG PO TABS: 150.0000 mg | ORAL_TABLET | Freq: Once | ORAL | 0 refills | Status: AC

## 2020-01-18 NOTE — ED Provider Notes (Signed)
[facility]    CSN: [id number] Arrival date & time: 01/18/20  1043      History   Chief Complaint Chief Complaint  Patient presents with  . Knee Pain  . Vaginal Discharge    HPI Renee Murphy is a 49 y.o. female.   HPI  Renee Murphy is a 49 y.o. female presenting to UC with c/o 2 months of intermittent Right knee pain and swelling. Pt was sitting in a chair with her legs curled up under her for about 45 minutes, when she went to stand up, she felt a "pop" in her knee.  Since then, she has had intermittent pain and swelling. Swelling has nearly resolved since yesterday but due to the duration of her symptoms, she decided to get it evaluated.  Pain is 3/10, aching.  Denies redness, bruising or warmth to her knee.  No hx of gout.  She is also c/o 1 month of vaginal discharge with an odor and intermittent vaginal pain. Denies fever, chills, n/v/d. Mild burning with urination on occasion.  No medication taken PTA. Hx of BV, yeast and trichomonas in the past.  She has not been on any antibiotics recently. Denies concern for STIs.    Past Medical History:  Diagnosis Date  . Bowel obstruction (HCC)   . Chronic pain of left ankle   . 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 Small bowel obstruction (HCC) 11/28/2015  . SBO (small bowel obstruction) (HCC) 03/26/2015    Past Surgical History:  Procedure Laterality Date  . ABDOMINAL HYSTERECTOMY    . HERNIA REPAIR    . URETHRA SURGERY      OB History   No obstetric history on file.      Home Medications    Prior to Admission medications   Medication Sig Start Date End Date Taking? Authorizing Provider  acetaminophen (TYLENOL) 325 MG tablet Take 650 mg by mouth every 6 (six) hours as needed.    [provider]  azithromycin (ZITHROMAX Z-PAK) 250 MG tablet Take 2 tabs today; then begin one tab once daily for 4 more days. 10/09/19    Lattie Haw, MD  famotidine (PEPCID) 10 MG tablet Take 10 mg by mouth 2 (two) times daily.    [provider]  fluconazole (DIFLUCAN) 150 MG tablet Take 1 tablet (150 mg total) by mouth once for 1 dose. May repeat in 3 days if still symptomatic 01/18/20 01/18/20  Lurene Shadow, PA-C  Menthol (HALLS COUGH DROPS MT) Use as directed in the mouth or throat as needed (cough.).    [provider]  predniSONE (DELTASONE) 20 MG tablet Take one tab by mouth twice daily for 4 days, then one daily for 3 days. Take with food. 10/09/19   Lattie Haw, MD    Family History Family History  Problem Relation Age of Onset  . Diabetes Mother   . Cancer Father     Social History Social History   Tobacco Use  . Smoking status: Never Smoker  . Smokeless tobacco: Never Used  Substance Use Topics  . Alcohol use: No  . Drug use: No     Allergies   Aspirin   Review of Systems Review of Systems  Constitutional: Negative for chills and fever.  Gastrointestinal: Negative for abdominal pain, diarrhea, nausea and vomiting.  Genitourinary: Positive for dysuria, vaginal discharge and vaginal pain. Negative for decreased urine volume,  frequency, genital sores, hematuria, menstrual problem, pelvic pain, urgency and vaginal bleeding.  Musculoskeletal: Positive for arthralgias and joint swelling. Negative for back pain, gait problem and myalgias.  Skin: Negative for color change and rash.     Physical Exam Triage Vital Signs ED Triage Vitals  Enc Vitals Group     BP 01/18/20 1057 137/85     Pulse Rate 01/18/20 1057 74     Resp 01/18/20 1057 17     Temp 01/18/20 1057 98.4 F (36.9 C)     Temp Source 01/18/20 1057 Oral     SpO2 01/18/20 1057 99 %     Weight --      Height --      Head Circumference --      Peak Flow --      Pain Score 01/18/20 1056 0     Pain Loc --      Pain Edu? --      Excl. in [place]? --    No data found.  Updated Vital Signs BP 137/85 (BP Location:  Left Arm)   Pulse 74   Temp 98.4 F (36.9 C) (Oral)   Resp 17   SpO2 99%   Visual Acuity Right Eye Distance:   Left Eye Distance:   Bilateral Distance:    Right Eye Near:   Left Eye Near:    Bilateral Near:     Physical Exam Vitals and nursing note reviewed.  Constitutional:      Appearance: Normal appearance. She is well-developed.  HENT:     Head: Normocephalic and atraumatic.  Cardiovascular:     Rate and Rhythm: Normal rate and regular rhythm.  Pulmonary:     Effort: Pulmonary effort is normal. No respiratory distress.     Breath sounds: Normal breath sounds.  Abdominal:     General: There is no distension.     Palpations: Abdomen is soft.     Tenderness: There is no abdominal tenderness. There is no right CVA tenderness or left CVA tenderness.  Musculoskeletal:        General: Tenderness present. No swelling. Normal range of motion.     Cervical back: Normal range of motion.     Right knee: Bony tenderness (over patela and tibial tuberosity ) present. No crepitus. Normal range of motion. Tenderness present over the lateral joint line, LCL and ACL. No medial joint line tenderness.  Skin:    General: Skin is warm and dry.  Neurological:     Mental Status: She is alert and oriented to person, place, and time.  Psychiatric:        Behavior: Behavior normal.      UC Treatments / Results  Labs (all labs ordered are listed, but only abnormal results are displayed) Labs Reviewed  POCT URINALYSIS DIP (MANUAL ENTRY) - Abnormal; Notable for the following components:      Result Value   Clarity, UA cloudy (*)    Blood, UA small (*)    Protein Ur, POC =30 (*)    All other components within normal limits  CERVICOVAGINAL ANCILLARY ONLY    EKG   Radiology DG Knee Complete 4 Views Right  Result Date: 01/18/2020 CLINICAL DATA:  Intermittent right lateral knee pain and swelling for the past 2 months. EXAM: RIGHT KNEE - COMPLETE 4+ VIEW COMPARISON:  None. FINDINGS: No  evidence of fracture, dislocation, or joint effusion. No evidence of arthropathy or other focal bone abnormality. Soft tissues are unremarkable. IMPRESSION: Negative. Electronically Signed  By: Obie Dredge M.D.   On: 01/18/2020 11:42    Procedures Procedures (including critical care time)  Medications Ordered in UC Medications - No data to display  Initial Impression / Assessment and Plan / UC Course  I have reviewed the triage vital signs and the nursing notes.  Pertinent labs & imaging results that were available during my care of the patient were reviewed by me and considered in my medical decision making (see chart for details).     Reviewed imaging with pt Knee brace provided for comfort Encouraged f/u with Sports Medicine later this week for further evaluation and treatment of recurrent knee pain and swelling.  UA: hematuria noted Vaginal self swab sent to lab for testing Will tx pt empirically for vaginal yeast infection AVS provided   Final Clinical Impressions(s) / UC Diagnoses   Final diagnoses:  Dysuria  Vaginal discharge  Pain and swelling of right knee     Discharge Instructions      You will be notified in 2-3 days of your vaginal swab results if they are abnormal. If medication is indicated, it can be called into your preferred pharmacy. If the swab is normal, you will not receive a call but your results can be seen in your free [facility] MyChart account.   Please follow up with family medicine or a gynecologist for further evaluation and treatment of vaginal symptoms.  Please call to schedule a follow up appointment with sports medicine for further evaluation and treatment of recurrent Right knee pain and swelling.    ED Prescriptions    Medication Sig Dispense Auth. Provider   fluconazole (DIFLUCAN) 150 MG tablet Take 1 tablet (150 mg total) by mouth once for 1 dose. May repeat in 3 days if still symptomatic 2 tablet Lurene Shadow, PA-C      PDMP not reviewed this encounter.   Lurene Shadow, [place] 01/18/20 1312

## 2020-01-18 NOTE — ED Triage Notes (Signed)
Patient presents to Urgent Care with complaints of right knee pain since two months ago. Patient reports she also has been having vaginal discharge for the past month, states there is an odor as well and intermittent pain in her vagina.

## 2020-01-18 NOTE — Discharge Instructions (Addendum)
°  You will be notified in 2-3 days of your vaginal swab results if they are abnormal. If medication is indicated, it can be called into your preferred pharmacy. If the swab is normal, you will not receive a call but your results can be seen in your free [facility] MyChart account.   Please follow up with family medicine or a gynecologist for further evaluation and treatment of vaginal symptoms.  Please call to schedule a follow up appointment with sports medicine for further evaluation and treatment of recurrent Right knee pain and swelling.

## 2020-01-19 ENCOUNTER — Ambulatory Visit (INDEPENDENT_AMBULATORY_CARE_PROVIDER_SITE_OTHER): Payer: 59 | Admitting: Sports Medicine

## 2020-01-19 ENCOUNTER — Encounter: Payer: Self-pay | Admitting: Sports Medicine

## 2020-01-19 ENCOUNTER — Ambulatory Visit (HOSPITAL_BASED_OUTPATIENT_CLINIC_OR_DEPARTMENT_OTHER)
Admission: RE | Admit: 2020-01-19 | Discharge: 2020-01-19 | Disposition: A | Discharge status: Home / Self Care | Payer: 59 | Source: Ambulatory Visit | Attending: Sports Medicine | Admitting: Sports Medicine

## 2020-01-19 DIAGNOSIS — M7989 Other specified soft tissue disorders: Secondary | ICD-10-CM | POA: Diagnosis present

## 2020-01-19 MED ORDER — MELOXICAM 15 MG PO TABS: ORAL_TABLET | ORAL | 3 refills | Status: DC

## 2020-01-19 NOTE — Progress Notes (Signed)
    Procedures performed today:    None.  Independent interpretation of notes and tests performed by another provider:   X-rays personally reviewed, they are normal.  Brief History, Exam, Impression, and Recommendations:    Right leg swelling This is a pleasant 49 year old female, she has had a history of recent pain and swelling in her knee, as well as down her leg on the right. This occurred after sitting for 45 minutes with her knees flexed underneath her. She has a positive Homans' sign today so we will get a DVT ultrasound stat tonight. She has some pain at the posterior/medial joint line concerning for a meniscal injury, we are going to also start meloxicam, and I would like to give her some meniscal rehab exercises, return to see me in 3 to 4 weeks, MRI versus injection if no better.    ___________________________________________ Ihor Austin. Benjamin Stain, M.D., ABFM., CAQSM. Primary Care and Sports Medicine [facility]  Adjunct Instructor of Family Medicine  [employer]

## 2020-01-19 NOTE — Assessment & Plan Note (Signed)
This is a pleasant 49 year old female, she has had a history of recent pain and swelling in her knee, as well as down her leg on the right. This occurred after sitting for 45 minutes with her knees flexed underneath her. She has a positive Homans' sign today so we will get a DVT ultrasound stat tonight. She has some pain at the posterior/medial joint line concerning for a meniscal injury, we are going to also start meloxicam, and I would like to give her some meniscal rehab exercises, return to see me in 3 to 4 weeks, MRI versus injection if no better.

## 2020-01-20 ENCOUNTER — Telehealth (HOSPITAL_COMMUNITY): Payer: Self-pay

## 2020-01-20 LAB — CERVICOVAGINAL ANCILLARY ONLY
Bacterial Vaginitis (gardnerella): POSITIVE — AB
Candida Glabrata: NEGATIVE
Candida Vaginitis: NEGATIVE
Chlamydia: NEGATIVE
Comment: NEGATIVE
Comment: NEGATIVE
Comment: NEGATIVE
Comment: NEGATIVE
Comment: NEGATIVE
Comment: NORMAL
Neisseria Gonorrhea: NEGATIVE
Trichomonas: NEGATIVE

## 2020-01-20 MED ORDER — METRONIDAZOLE 500 MG PO TABS: 500.0000 mg | ORAL_TABLET | Freq: Two times a day (BID) | ORAL | 0 refills | Status: DC

## 2020-02-12 ENCOUNTER — Emergency Department (HOSPITAL_BASED_OUTPATIENT_CLINIC_OR_DEPARTMENT_OTHER): Payer: 59

## 2020-02-12 ENCOUNTER — Encounter (HOSPITAL_BASED_OUTPATIENT_CLINIC_OR_DEPARTMENT_OTHER): Payer: Self-pay

## 2020-02-12 ENCOUNTER — Other Ambulatory Visit: Payer: Self-pay

## 2020-02-12 ENCOUNTER — Inpatient Hospital Stay (HOSPITAL_BASED_OUTPATIENT_CLINIC_OR_DEPARTMENT_OTHER)
Admission: EM | Admit: 2020-02-12 | Discharge: 2020-02-17 | DRG: 389 | Disposition: A | Discharge status: Home / Self Care | Payer: 59 | Source: Ambulatory Visit | Attending: Internal Medicine | Admitting: Internal Medicine

## 2020-02-12 ENCOUNTER — Inpatient Hospital Stay (HOSPITAL_BASED_OUTPATIENT_CLINIC_OR_DEPARTMENT_OTHER): Payer: 59

## 2020-02-12 DIAGNOSIS — B948 Sequelae of other specified infectious and parasitic diseases: Secondary | ICD-10-CM

## 2020-02-12 DIAGNOSIS — I10 Essential (primary) hypertension: Secondary | ICD-10-CM | POA: Diagnosis present

## 2020-02-12 DIAGNOSIS — K56609 Unspecified intestinal obstruction, unspecified as to partial versus complete obstruction: Secondary | ICD-10-CM | POA: Diagnosis present

## 2020-02-12 DIAGNOSIS — Z79899 Other long term (current) drug therapy: Secondary | ICD-10-CM

## 2020-02-12 DIAGNOSIS — Z6828 Body mass index (BMI) 28.0-28.9, adult: Secondary | ICD-10-CM | POA: Diagnosis not present

## 2020-02-12 DIAGNOSIS — N179 Acute kidney failure, unspecified: Secondary | ICD-10-CM | POA: Diagnosis present

## 2020-02-12 DIAGNOSIS — B349 Viral infection, unspecified: Secondary | ICD-10-CM | POA: Diagnosis present

## 2020-02-12 DIAGNOSIS — E663 Overweight: Secondary | ICD-10-CM | POA: Diagnosis present

## 2020-02-12 DIAGNOSIS — Z886 Allergy status to analgesic agent status: Secondary | ICD-10-CM

## 2020-02-12 DIAGNOSIS — E860 Dehydration: Secondary | ICD-10-CM | POA: Diagnosis present

## 2020-02-12 DIAGNOSIS — Z90710 Acquired absence of both cervix and uterus: Secondary | ICD-10-CM | POA: Diagnosis not present

## 2020-02-12 DIAGNOSIS — Z833 Family history of diabetes mellitus: Secondary | ICD-10-CM | POA: Diagnosis not present

## 2020-02-12 DIAGNOSIS — K56600 Partial intestinal obstruction, unspecified as to cause: Secondary | ICD-10-CM | POA: Diagnosis present

## 2020-02-12 DIAGNOSIS — Z0189 Encounter for other specified special examinations: Secondary | ICD-10-CM

## 2020-02-12 LAB — COMPREHENSIVE METABOLIC PANEL
ALT: 18 U/L (ref 0–44)
AST: 20 U/L (ref 15–41)
Albumin: 4.6 g/dL (ref 3.5–5.0)
Alkaline Phosphatase: 56 U/L (ref 38–126)
Anion gap: 11 (ref 5–15)
BUN: 17 mg/dL (ref 6–20)
CO2: 25 mmol/L (ref 22–32)
Calcium: 9.4 mg/dL (ref 8.9–10.3)
Chloride: 100 mmol/L (ref 98–111)
Creatinine, Ser: 1.15 mg/dL — ABNORMAL HIGH (ref 0.44–1.00)
GFR calc Af Amer: >60 mL/min (ref >60)
GFR calc non Af Amer: 57 mL/min — ABNORMAL LOW (ref >60)
Glucose, Bld: 127 mg/dL — ABNORMAL HIGH (ref 70–99)
Potassium: 4.4 mmol/L (ref 3.5–5.1)
Sodium: 136 mmol/L (ref 135–145)
Total Bilirubin: 0.3 mg/dL (ref 0.3–1.2)
Total Protein: 8.7 g/dL — ABNORMAL HIGH (ref 6.5–8.1)

## 2020-02-12 LAB — CBC WITH DIFFERENTIAL/PLATELET
Abs Immature Granulocytes: 0.04 10*3/uL (ref 0.00–0.07)
Basophils Absolute: 0 10*3/uL (ref 0.0–0.1)
Basophils Relative: 0 %
Eosinophils Absolute: 0 10*3/uL (ref 0.0–0.5)
Eosinophils Relative: 0 %
HCT: 42.8 % (ref 36.0–46.0)
Hemoglobin: 14.2 g/dL (ref 12.0–15.0)
Immature Granulocytes: 0 %
Lymphocytes Relative: 20 %
Lymphs Abs: 1.8 10*3/uL (ref 0.7–4.0)
MCH: 30.4 pg (ref 26.0–34.0)
MCHC: 33.2 g/dL (ref 30.0–36.0)
MCV: 91.6 fL (ref 80.0–100.0)
Monocytes Absolute: 0.3 10*3/uL (ref 0.1–1.0)
Monocytes Relative: 3 %
Neutro Abs: 7.1 10*3/uL (ref 1.7–7.7)
Neutrophils Relative %: 77 %
Platelets: 325 10*3/uL (ref 150–400)
RBC: 4.67 MIL/uL (ref 3.87–5.11)
RDW: 12.4 % (ref 11.5–15.5)
WBC: 9.2 10*3/uL (ref 4.0–10.5)
nRBC: 0 % (ref 0.0–0.2)

## 2020-02-12 LAB — URINALYSIS, MICROSCOPIC (REFLEX)

## 2020-02-12 LAB — URINALYSIS, ROUTINE W REFLEX MICROSCOPIC
Bilirubin Urine: NEGATIVE
Glucose, UA: NEGATIVE mg/dL
Ketones, ur: NEGATIVE mg/dL
Leukocytes,Ua: NEGATIVE
Nitrite: NEGATIVE
Protein, ur: NEGATIVE mg/dL
Specific Gravity, Urine: 1.01 (ref 1.005–1.030)
pH: 7.5 (ref 5.0–8.0)

## 2020-02-12 LAB — LIPASE, BLOOD: Lipase: 23 U/L (ref 11–51)

## 2020-02-12 LAB — SARS CORONAVIRUS 2 BY RT PCR (HOSPITAL ORDER, PERFORMED IN ~~LOC~~ HOSPITAL LAB): SARS Coronavirus 2: NEGATIVE

## 2020-02-12 MED ORDER — ONDANSETRON HCL 4 MG/2ML IJ SOLN
4.0000 mg | Freq: Once | INTRAMUSCULAR | Status: AC
  Administered 2020-02-12: 4 mg via INTRAVENOUS
  Filled 2020-02-12: qty 2

## 2020-02-12 MED ORDER — ONDANSETRON HCL 4 MG PO TABS: 4.0000 mg | ORAL_TABLET | Freq: Four times a day (QID) | ORAL | Status: DC | PRN

## 2020-02-12 MED ORDER — HYDROMORPHONE HCL 1 MG/ML IJ SOLN
1.0000 mg | Freq: Once | INTRAMUSCULAR | Status: AC
  Administered 2020-02-12: 1 mg via INTRAVENOUS
  Filled 2020-02-12: qty 1

## 2020-02-12 MED ORDER — ONDANSETRON HCL 4 MG/2ML IJ SOLN
INTRAMUSCULAR | Status: AC
  Administered 2020-02-12: 4 mg via INTRAVENOUS
  Filled 2020-02-12: qty 2

## 2020-02-12 MED ORDER — PHENOL 1.4 % MT LIQD
1.0000 | OROMUCOSAL | Status: DC | PRN
  Administered 2020-02-14 – 2020-02-15 (×2): 1 via OROMUCOSAL
  Filled 2020-02-12: qty 177

## 2020-02-12 MED ORDER — MORPHINE SULFATE (PF) 2 MG/ML IV SOLN
1.0000 mg | INTRAVENOUS | Status: DC | PRN
  Administered 2020-02-12 – 2020-02-13 (×4): 1 mg via INTRAVENOUS
  Filled 2020-02-12 (×4): qty 1

## 2020-02-12 MED ORDER — MORPHINE SULFATE (PF) 4 MG/ML IV SOLN
4.0000 mg | Freq: Once | INTRAVENOUS | Status: AC
  Administered 2020-02-12: 4 mg via INTRAVENOUS
  Filled 2020-02-12: qty 1

## 2020-02-12 MED ORDER — ONDANSETRON HCL 4 MG/2ML IJ SOLN
4.0000 mg | Freq: Four times a day (QID) | INTRAMUSCULAR | Status: DC | PRN
  Administered 2020-02-12 – 2020-02-13 (×3): 4 mg via INTRAVENOUS
  Filled 2020-02-12 (×3): qty 2

## 2020-02-12 MED ORDER — ALBUTEROL SULFATE HFA 108 (90 BASE) MCG/ACT IN AERS: 1.0000 | INHALATION_SPRAY | Freq: Four times a day (QID) | RESPIRATORY_TRACT | Status: DC | PRN

## 2020-02-12 MED ORDER — IOHEXOL 300 MG/ML  SOLN
100.0000 mL | Freq: Once | INTRAMUSCULAR | Status: AC | PRN
  Administered 2020-02-12: 100 mL via INTRAVENOUS

## 2020-02-12 MED ORDER — DEXTROSE-NACL 5-0.9 % IV SOLN: INTRAVENOUS | Status: AC

## 2020-02-12 MED ORDER — ONDANSETRON HCL 4 MG/2ML IJ SOLN: 4.0000 mg | Freq: Once | INTRAMUSCULAR | Status: AC

## 2020-02-12 MED ORDER — SODIUM CHLORIDE 0.9 % IV BOLUS
1000.0000 mL | Freq: Once | INTRAVENOUS | Status: AC
  Administered 2020-02-12: 1000 mL via INTRAVENOUS

## 2020-02-12 MED ORDER — ALBUTEROL SULFATE (2.5 MG/3ML) 0.083% IN NEBU: 2.5000 mg | INHALATION_SOLUTION | Freq: Four times a day (QID) | RESPIRATORY_TRACT | Status: DC | PRN

## 2020-02-12 NOTE — ED Provider Notes (Signed)
[facility] EMERGENCY DEPARTMENT Provider Note   CSN: [id number] Arrival date & time: 02/12/20  1340     History Chief Complaint  Patient presents with  . Abdominal Pain    Renee Murphy is a 49 y.o. female with a past medical history significant for previous bowel obstructions who presents to the ED due to severe, generalized abdominal pain associated with 3 episodes of nonbloody, nonbilious emesis that started today.  Abdominal pain is located in bilateral lower quadrants into left flank region and radiates down to left groin. Patient notes she started feeling unwell yesterday with a headache and fatigue which progressed to severe abdominal pain today. Denies fever and chills. Admits to taking Flagyl 2 weeks ago, but no other recent antibiotics. Denies ingestion of undercooked foods. Denies associated vaginal and urinary symptoms. No concern for STDs at this time. Denies fever and chills. She has taken tylenol with no relief. No aggravating or alleviating factors.   History obtained from patient and past medical records. No interpreter used during encounter.      Past Medical History:  Diagnosis Date  . Bowel obstruction (HCC)   . Chronic pain of left ankle   . 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 Right leg swelling 01/19/2020  . Small bowel obstruction (HCC) 11/28/2015  . SBO (small bowel obstruction) (HCC) 03/26/2015    Past Surgical History:  Procedure Laterality Date  . ABDOMINAL HYSTERECTOMY    . HERNIA REPAIR    . URETHRA SURGERY       OB History   No obstetric history on file.     Family History  Problem Relation Age of Onset  . Diabetes Mother   . Cancer Father     Social History   Tobacco Use  . Smoking status: Never Smoker  . Smokeless tobacco: Never Used  Substance Use Topics  . Alcohol use: No  . Drug use: No    Home Medications Prior to Admission medications     Medication Sig Start Date End Date Taking? Authorizing Provider  acetaminophen (TYLENOL) 325 MG tablet Take 650 mg by mouth every 6 (six) hours as needed.    [provider]  meloxicam (MOBIC) 15 MG tablet One tab PO qAM with a meal for 2 weeks, then daily prn pain. 01/19/20   Monica Becton, MD  metroNIDAZOLE (FLAGYL) 500 MG tablet Take 1 tablet (500 mg total) by mouth 2 (two) times daily. 01/20/20   Lamptey, Britta Mccreedy, MD    Allergies    Aspirin  Review of Systems   Review of Systems  Constitutional: Positive for fatigue. Negative for chills and fever.  Gastrointestinal: Positive for abdominal pain, nausea and vomiting. Negative for diarrhea.  Genitourinary: Positive for flank pain. Negative for dysuria and vaginal discharge.  Neurological: Positive for headaches (resolved).  All other systems reviewed and are negative.   Physical Exam Updated Vital Signs BP (!) 164/98 (BP Location: Left Arm)   Pulse 75   Temp 98.7 F (37.1 C) (Oral)   Resp 20   SpO2 100%   Physical Exam Vitals and nursing note reviewed.  Constitutional:      General: She is not in acute distress.    Appearance: She is not ill-appearing.  HENT:     Head: Normocephalic.  Eyes:     Pupils: Pupils are equal, round, and reactive to light.  Cardiovascular:     Rate and  Rhythm: Normal rate and regular rhythm.     Pulses: Normal pulses.     Heart sounds: Normal heart sounds. No murmur. No friction rub. No gallop.   Pulmonary:     Effort: Pulmonary effort is normal.     Breath sounds: Normal breath sounds.  Abdominal:     General: Abdomen is flat. Bowel sounds are normal. There is no distension.     Palpations: Abdomen is soft.     Tenderness: There is abdominal tenderness. There is left CVA tenderness and guarding. There is no right CVA tenderness or rebound.     Comments: Generalized abdominal tenderness with voluntary guarding.   Musculoskeletal:     Cervical back: Neck supple.      Comments: Able to move all 4 extremities without difficulty.   Skin:    General: Skin is warm and dry.  Neurological:     General: No focal deficit present.     Mental Status: She is alert.  Psychiatric:        Mood and Affect: Mood normal.        Behavior: Behavior normal.     ED Results / Procedures / Treatments   Labs (all labs ordered are listed, but only abnormal results are displayed) Labs Reviewed  COMPREHENSIVE METABOLIC PANEL - Abnormal; Notable for the following components:      Result Value   Glucose, Bld 127 (*)    Creatinine, Ser 1.15 (*)    Total Protein 8.7 (*)    GFR calc non Af Amer 57 (*)    All other components within normal limits  URINALYSIS, ROUTINE W REFLEX MICROSCOPIC - Abnormal; Notable for the following components:   Hgb urine dipstick TRACE (*)    All other components within normal limits  URINALYSIS, MICROSCOPIC (REFLEX) - Abnormal; Notable for the following components:   Bacteria, UA FEW (*)    All other components within normal limits  SARS CORONAVIRUS 2 BY RT PCR (HOSPITAL ORDER, [place] [facility])  CBC WITH DIFFERENTIAL/PLATELET  LIPASE, BLOOD    EKG None  Radiology CT ABDOMEN PELVIS W CONTRAST  Result Date: 02/12/2020 CLINICAL DATA:  Nausea, vomiting EXAM: CT ABDOMEN AND PELVIS WITH CONTRAST TECHNIQUE: Multidetector CT imaging of the abdomen and pelvis was performed using the standard protocol following bolus administration of intravenous contrast. CONTRAST:  139mL OMNIPAQUE IOHEXOL 300 MG/ML  SOLN COMPARISON:  2017 FINDINGS: Lower chest: No acute abnormality. Hepatobiliary: Too small to characterize hypoattenuating lesions of the right hepatic lobe. Gallbladder is unremarkable. Pancreas: Unremarkable. Spleen: Unremarkable. Adrenals/Urinary Tract: Adrenals, kidneys, and bladder are unremarkable. Stomach/Bowel: Small hiatal hernia. Stomach is otherwise within normal limits. There are several fluid-filled dilated loops of small  bowel. Transition point appears to be in the pelvis ventral to the bladder (series 2, image 69) with more distal ileum demonstrating decompressed appearance. Proximal extent of dilatation is within the left quadrant with nondilated proximal small bowel seen beginning on series 5, image 34. Normal appendix. Vascular/Lymphatic: No significant vascular findings are present. No enlarged abdominal or pelvic lymph nodes. Reproductive: Status post hysterectomy. No adnexal masses. Other: Postoperative changes in the ventral abdominal wall. No ascites. Musculoskeletal: No acute osseous abnormality. IMPRESSION: Distal small-bowel obstruction with suspected transition point in the pelvis anterior to the bladder. Likely same transition point seen on 2017 study. Electronically Signed   By: Macy Mis M.D.   On: 02/12/2020 16:11    Procedures Procedures (including critical care time)  Medications Ordered in ED Medications  sodium chloride 0.9 % bolus 1,000 mL (0 mLs Intravenous Stopped 02/12/20 1606)  ondansetron (ZOFRAN) injection 4 mg (4 mg Intravenous Given 02/12/20 1430)  morphine 4 MG/ML injection 4 mg (4 mg Intravenous Given 02/12/20 1431)  iohexol (OMNIPAQUE) 300 MG/ML solution 100 mL (100 mLs Intravenous Contrast Given 02/12/20 1527)  HYDROmorphone (DILAUDID) injection 1 mg (1 mg Intravenous Given 02/12/20 1639)    ED Course  I have reviewed the triage vital signs and the nursing notes.  Pertinent labs & imaging results that were available during my care of the patient were reviewed by me and considered in my medical decision making (see chart for details).  Clinical Course as of Feb 12 1839  Fri Feb 12, 2020  1642 Discussed case with Dr. Daphine Deutscher with general surgery who recommends medical admission.  General surgery will follow during patient's hospital stay.   [CA]  1711 Discussed case with Dr. Tyson Babinski with [facility] who agrees to admit patient for further evaluation. NG tube placed.    [CA]    Clinical  Course User Index [CA] Mannie Stabile, PA-C   MDM Rules/Calculators/A&P                     49 year old female presents to the ED due to generalized abdominal pain associated with nausea and vomiting that started earlier today.  Patient has a history of numerous past bowel obstructions.  Denies urinary and vaginal symptoms.  No concerns for STDs at this time.  Upon arrival, patient is afebrile, not tachycardic or hypoxic.  Patient in no acute distress and nontoxic-appearing.  Abdomen soft, nondistended with generalized abdominal tenderness with voluntary guarding.  Positive left CVA tenderness.  Will obtain routine labs and UA to rule out signs of infection and electrolyte abnormalities.  Given patient's numerous bowel obstructions will obtain CT abdomen to rule out acute abdomen. Patient given morphine, IVFs, and IV zofran for symptomatic relief.   CBC unremarkable with no leukocytosis.  UA negative for signs of infection with mild hematuria.  Covid test negative.  Lipase normal at 23.  Doubt pancreatitis.  CMP reassuring with normal LFTs, mild elevated creatinine at 1.15, no major electrolyte derangements. CT abdomen personally reviewed which demonstrates: IMPRESSION:  Distal small-bowel obstruction with suspected transition point in  the pelvis anterior to the bladder. Likely same transition point  seen on 2017 study.     Will consult general surgery for further evaluation. Patient's last po intake was 7PM yesterday.  Discussed case with Dr. Daphine Deutscher with general surgery. See note above. Discussed case with Dr. Tyson Babinski with [facility] who agrees to admit patient for further treatment. NG tube placed.   Discussed case with Dr. Stevie Kern who evaluated patient at bedside and agrees with assessment and plan.  Final Clinical Impression(s) / ED Diagnoses Final diagnoses:  Small bowel obstruction [facility])    Rx / DC Orders ED Discharge Orders    None       Mannie Stabile, PA-C 02/12/20  1842    Milagros Loll, MD 02/14/20 [phone]

## 2020-02-12 NOTE — ED Notes (Signed)
ED Provider at bedside. 

## 2020-02-12 NOTE — ED Notes (Signed)
Patient has a ready bed at [facility]. [facility] called for transport at 2008 spoke with Selena Batten

## 2020-02-12 NOTE — Plan of Care (Signed)

## 2020-02-12 NOTE — H&P (Signed)
History and Physical    Renee Murphy XVQ:[id number] DOB: [birth year] DOA: 02/12/2020  PCP: Angelica Chessman, MD  Patient coming from: Home.  Chief Complaint: Abdominal pain nausea vomiting.  HPI: Renee Murphy is a 49 y.o. female with history of previous abdominal surgeries including hysterectomy and hernia repair urethral surgery and previous history of bowel obstruction about 4 to 5 years ago when patient was managed conservatively at [facility] presents to the ER at [facility] with complaints of abdominal pain mostly in the lower quadrant with nausea vomiting since this morning.  Denies any blood in the vomitus.  Did have a small bowel movement prior to this incident this morning.  Due to persistent symptoms patient presents to the ER.  ED Course: In the ER patient had CT abdomen pelvis which shows features consistent with small bowel obstruction with transition point for which patient was placed on NG tube.  Covid test was negative and labs were showing creatinine of 1.1 CBC unremarkable Covid test negative.  Patient admitted for further management of small bowel obstruction.  Review of Systems: As per HPI, rest all negative.   Past Medical History:  Diagnosis Date  . Bowel obstruction (HCC)   . Chronic pain of left ankle   . 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 ABDOMINAL HYSTERECTOMY    . HERNIA REPAIR    . URETHRA SURGERY       reports that she has never smoked. She has never used smokeless tobacco. She reports that she does not drink alcohol or use drugs.  Allergies  Allergen Reactions  . Aspirin Nausea And Vomiting    Family History  Problem Relation Age of Onset  . Diabetes Mother   . Cancer Father     Prior to Admission medications   Medication Sig Start Date End Date Taking? Authorizing Provider  acetaminophen (TYLENOL) 325 MG tablet Take 650 mg by mouth every 6 (six) hours  as needed for moderate pain.    Yes [provider]  albuterol (VENTOLIN HFA) 108 (90 Base) MCG/ACT inhaler Inhale 1 puff into the lungs every 6 (six) hours as needed for wheezing or shortness of breath.  10/19/19  Yes [provider]  FLOVENT HFA 110 MCG/ACT inhaler Inhale 1 puff into the lungs 2 (two) times daily.  11/09/19  Yes [provider]  meloxicam (MOBIC) 15 MG tablet One tab PO qAM with a meal for 2 weeks, then daily prn pain. Patient not taking: Reported on 02/12/2020 01/19/20   Monica Becton, MD  metroNIDAZOLE (FLAGYL) 500 MG tablet Take 1 tablet (500 mg total) by mouth 2 (two) times daily. Patient not taking: Reported on 02/12/2020 01/20/20   Merrilee Jansky, MD    Physical Exam: Constitutional: Moderately built and nourished. Vitals:   02/12/20 1915 02/12/20 2015 02/12/20 2030 02/12/20 2148  BP: 136/79 (!) 152/89 (!) 147/84 (!) 163/84  Pulse: 71 71 71 71  Resp:  16  16  Temp:  98.5 F (36.9 C)  97.8 F (36.6 C)  TempSrc:  Oral  Oral  SpO2: 97% 100% 100% 100%   Eyes: Anicteric no pallor. ENMT: No discharge from the ears eyes nose or mouth. Neck: No mass felt.  No neck rigidity. Respiratory: No rhonchi or crepitations. Cardiovascular: S1-S2 heard. Abdomen: Soft mild distended bowel sounds not appreciated. Musculoskeletal: No edema. Skin: No rash. Neurologic: Alert awake oriented time place and person.  Moves all extremities. Psychiatric: Appears normal.  Normal affect.   Labs on Admission: I have personally reviewed following labs and imaging studies  CBC: Recent Labs  Lab 02/12/20 1415  WBC 9.2  NEUTROABS 7.1  HGB 14.2  HCT 42.8  MCV 91.6  PLT 694   Basic Metabolic Panel: Recent Labs  Lab 02/12/20 1415  NA 136  K 4.4  CL 100  CO2 25  GLUCOSE 127*  BUN 17  CREATININE 1.15*  CALCIUM 9.4   GFR: CrCl cannot be calculated (Unknown ideal weight.). Liver Function Tests: Recent Labs  Lab 02/12/20 1415  AST 20  ALT 18   ALKPHOS 56  BILITOT 0.3  PROT 8.7*  ALBUMIN 4.6   Recent Labs  Lab 02/12/20 1415  LIPASE 23   No results for input(s): AMMONIA in the last 168 hours. Coagulation Profile: No results for input(s): INR, PROTIME in the last 168 hours. Cardiac Enzymes: No results for input(s): CKTOTAL, CKMB, CKMBINDEX, TROPONINI in the last 168 hours. BNP (last 3 results) No results for input(s): PROBNP in the last 8760 hours. HbA1C: No results for input(s): HGBA1C in the last 72 hours. CBG: No results for input(s): GLUCAP in the last 168 hours. Lipid Profile: No results for input(s): CHOL, HDL, LDLCALC, TRIG, CHOLHDL, LDLDIRECT in the last 72 hours. Thyroid Function Tests: No results for input(s): TSH, T4TOTAL, FREET4, T3FREE, THYROIDAB in the last 72 hours. Anemia Panel: No results for input(s): VITAMINB12, FOLATE, FERRITIN, TIBC, IRON, RETICCTPCT in the last 72 hours. Urine analysis:    Component Value Date/Time   COLORURINE YELLOW 02/12/2020 1415   APPEARANCEUR CLEAR 02/12/2020 1415   LABSPEC 1.010 02/12/2020 1415   PHURINE 7.5 02/12/2020 1415   GLUCOSEU NEGATIVE 02/12/2020 1415   HGBUR TRACE (A) 02/12/2020 1415   BILIRUBINUR NEGATIVE 02/12/2020 1415   BILIRUBINUR negative 01/18/2020 1122   KETONESUR NEGATIVE 02/12/2020 1415   PROTEINUR NEGATIVE 02/12/2020 1415   UROBILINOGEN 0.2 01/18/2020 1122   UROBILINOGEN 0.2 03/25/2015 1925   NITRITE NEGATIVE 02/12/2020 1415   LEUKOCYTESUR NEGATIVE 02/12/2020 1415   Sepsis Labs: @LABRCNTIP (procalcitonin:4,lacticidven:4) ) Recent Results (from the past 240 hour(s))  SARS Coronavirus 2 by RT PCR (hospital order, performed in [place] hospital lab) Nasopharyngeal Nasopharyngeal Swab     Status: None   Collection Time: 02/12/20  2:15 PM   Specimen: Nasopharyngeal Swab  Result Value Ref Range Status   SARS Coronavirus 2 NEGATIVE NEGATIVE Final    Comment: (NOTE) SARS-CoV-2 target nucleic acids are NOT DETECTED. The SARS-CoV-2 RNA is  generally detectable in upper and lower respiratory specimens during the acute phase of infection. The lowest concentration of SARS-CoV-2 viral copies this assay can detect is 250 copies / mL. A negative result does not preclude SARS-CoV-2 infection and should not be used as the sole basis for treatment or other patient management decisions.  A negative result may occur with improper specimen collection / handling, submission of specimen other than nasopharyngeal swab, presence of viral mutation(s) within the areas targeted by this assay, and inadequate number of viral copies (<250 copies / mL). A negative result must be combined with clinical observations, patient history, and epidemiological information. Fact Sheet for Patients:   StrictlyIdeas.no Fact Sheet for Healthcare Providers: BankingDealers.co.za This test is not yet approved or cleared  by the [place] FDA and has been authorized for detection and/or diagnosis of SARS-CoV-2 by FDA under an Emergency Use Authorization (EUA).  This EUA will remain in effect (meaning this test can be used)  for the duration of the COVID-19 declaration under Section 564(b)(1) of the Act, 21 U.S.C. section 360bbb-3(b)(1), unless the authorization is terminated or revoked sooner. Performed at [facility], [address] [place], [place] [address]      Radiological Exams on Admission: DG Abdomen 1 View  Result Date: 02/12/2020 CLINICAL DATA:  NG tube placement EXAM: ABDOMEN - 1 VIEW COMPARISON:  09/25/2016, CT 02/12/2020 FINDINGS: Esophageal tube side-port over the distal esophagus. Contrast within the renal collecting systems. IMPRESSION: Esophageal tube tip and side port overlie distal esophagus/GE junction, recommend further advancement by at least 5-10 cm for more optimal positioning. Electronically Signed   By: Jasmine Pang M.D.   On: 02/12/2020 18:45   CT ABDOMEN PELVIS W  CONTRAST  Result Date: 02/12/2020 CLINICAL DATA:  Nausea, vomiting EXAM: CT ABDOMEN AND PELVIS WITH CONTRAST TECHNIQUE: Multidetector CT imaging of the abdomen and pelvis was performed using the standard protocol following bolus administration of intravenous contrast. CONTRAST:  OMNIPAQUE IOHEXOL 300 MG/ML  SOLN COMPARISON:  2017 FINDINGS: Lower chest: No acute abnormality. Hepatobiliary: Too small to characterize hypoattenuating lesions of the right hepatic lobe. Gallbladder is unremarkable. Pancreas: Unremarkable. Spleen: Unremarkable. Adrenals/Urinary Tract: Adrenals, kidneys, and bladder are unremarkable. Stomach/Bowel: Small hiatal hernia. Stomach is otherwise within normal limits. There are several fluid-filled dilated loops of small bowel. Transition point appears to be in the pelvis ventral to the bladder (series 2, image 69) with more distal ileum demonstrating decompressed appearance. Proximal extent of dilatation is within the left quadrant with nondilated proximal small bowel seen beginning on series 5, image 34. Normal appendix. Vascular/Lymphatic: No significant vascular findings are present. No enlarged abdominal or pelvic lymph nodes. Reproductive: Status post hysterectomy. No adnexal masses. Other: Postoperative changes in the ventral abdominal wall. No ascites. Musculoskeletal: No acute osseous abnormality. IMPRESSION: Distal small-bowel obstruction with suspected transition point in the pelvis anterior to the bladder. Likely same transition point seen on 2017 study. Electronically Signed   By: Guadlupe Spanish M.D.   On: 02/12/2020 16:11      Assessment/Plan Principal Problem:   SBO (small bowel obstruction) (HCC)    1. Small bowel obstruction for which patient has been placed on NG tube suctioning and n.p.o. IV fluids pain relief medication we will consult general surgery. 2. 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 3. Acute renal failure  likely from dehydration which I think will improve with fluids.  Since patient has small bowel obstruction and will need more than 2 midnight stay will admit as inpatient.   DVT prophylaxis: SCDs.  We will hold off pharmacologic DVT prophylaxis for now in anticipation of procedure. Code Status: Full code. Family Communication: Patient's husband. Disposition Plan: Home. Consults called: We will consult general surgery. Admission status: Inpatient.   Eduard Clos MD [facility] Pager [phone].  If 7PM-7AM, please contact night-coverage [email or web address] Password [facility]  02/12/2020, 10:28 PM

## 2020-02-12 NOTE — ED Triage Notes (Signed)
Pt c/o abd pain, n/v x today-HA and fatigue yesterday-to triage in w/c

## 2020-02-13 ENCOUNTER — Inpatient Hospital Stay (HOSPITAL_COMMUNITY): Payer: 59

## 2020-02-13 LAB — CBC
HCT: 39.1 % (ref 36.0–46.0)
Hemoglobin: 12.7 g/dL (ref 12.0–15.0)
MCH: 30.6 pg (ref 26.0–34.0)
MCHC: 32.5 g/dL (ref 30.0–36.0)
MCV: 94.2 fL (ref 80.0–100.0)
Platelets: 305 10*3/uL (ref 150–400)
RBC: 4.15 MIL/uL (ref 3.87–5.11)
RDW: 12.6 % (ref 11.5–15.5)
WBC: 8.5 10*3/uL (ref 4.0–10.5)
nRBC: 0 % (ref 0.0–0.2)

## 2020-02-13 LAB — BASIC METABOLIC PANEL
Anion gap: 8 (ref 5–15)
BUN: 17 mg/dL (ref 6–20)
CO2: 25 mmol/L (ref 22–32)
Calcium: 8.7 mg/dL — ABNORMAL LOW (ref 8.9–10.3)
Chloride: 105 mmol/L (ref 98–111)
Creatinine, Ser: 0.96 mg/dL (ref 0.44–1.00)
GFR calc Af Amer: >60 mL/min (ref >60)
GFR calc non Af Amer: >60 mL/min (ref >60)
Glucose, Bld: 160 mg/dL — ABNORMAL HIGH (ref 70–99)
Potassium: 3.9 mmol/L (ref 3.5–5.1)
Sodium: 138 mmol/L (ref 135–145)

## 2020-02-13 LAB — HIV ANTIBODY (ROUTINE TESTING W REFLEX): HIV Screen 4th Generation wRfx: NONREACTIVE

## 2020-02-13 LAB — GLUCOSE, CAPILLARY
Glucose-Capillary: 111 mg/dL — ABNORMAL HIGH (ref 70–99)
Glucose-Capillary: 124 mg/dL — ABNORMAL HIGH (ref 70–99)
Glucose-Capillary: 166 mg/dL — ABNORMAL HIGH (ref 70–99)

## 2020-02-13 MED ORDER — POLYETHYLENE GLYCOL 3350 17 G PO PACK: 17.0000 g | PACK | Freq: Every day | ORAL | Status: DC | PRN

## 2020-02-13 MED ORDER — SENNOSIDES-DOCUSATE SODIUM 8.6-50 MG PO TABS: 2.0000 | ORAL_TABLET | Freq: Every evening | ORAL | Status: DC | PRN

## 2020-02-13 MED ORDER — HYDROMORPHONE HCL 1 MG/ML IJ SOLN
0.5000 mg | INTRAMUSCULAR | Status: DC | PRN
  Administered 2020-02-13: 1 mg via INTRAVENOUS
  Filled 2020-02-13: qty 1

## 2020-02-13 MED ORDER — LABETALOL HCL 5 MG/ML IV SOLN
20.0000 mg | INTRAVENOUS | Status: DC | PRN
  Administered 2020-02-13 (×2): 20 mg via INTRAVENOUS
  Filled 2020-02-13 (×3): qty 4

## 2020-02-13 MED ORDER — DIATRIZOATE MEGLUMINE & SODIUM 66-10 % PO SOLN
90.0000 mL | Freq: Once | ORAL | Status: AC
  Administered 2020-02-13: 90 mL via NASOGASTRIC
  Filled 2020-02-13: qty 90

## 2020-02-13 NOTE — Consult Note (Signed)
CC: abd pain, n/v  Requesting provider: Dr Nelson Chimes  HPI: Renee Murphy is an 49 y.o. female who is here for nausea, vomiting, and abdominal pain.  She was transferred from [facility] with a partial small bowel obstruction.  She has a history of 2 prior admissions for same thing 1 in July 2016 and another in March 2017 both were managed nonsurgically.  She states that she started having some issues early in the week where she felt distended and her appetite went down.  She also started having headache and worsening fatigue on Thursday but it was Friday morning when she had pain as well as vomiting which prompted her to go to the urgent care center for evaluation.  A CT scan there showed partial small bowel obstruction with a transition point probably in the lower pelvis anterior to the bladder.  She states her last bowel movement was on Friday along with flatus.  She denies any fevers or chills.  Prior surgery includes a hysterectomy with ureteral resection and reimplantation.  She also has had a laparoscopic incisional hernia repair with mesh.  I do not have records of either procedure.  Has had about 500 cc of bilious output from her NG tube  She had COVID-19 a few months ago and continues to suffer from cough, shortness of breath, fatigue as well as some smell issues.  She is on an inhaler.  She just saw her pulmonologist on June 1.  I reviewed her hospitalization records from July 2016, March 2017 as well as her office note from her pulmonologist from June 1  Past Medical History:  Diagnosis Date  . Bowel obstruction (HCC)   . Chronic pain of left ankle   . 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 ABDOMINAL HYSTERECTOMY    . HERNIA REPAIR    . URETHRA SURGERY      Family History  Problem Relation Age of Onset  . Diabetes Mother   . Cancer Father     Social:  reports that she has never smoked. She has never  used smokeless tobacco. She reports that she does not drink alcohol or use drugs.  Allergies:  Allergies  Allergen Reactions  . Aspirin Nausea And Vomiting    Medications: I have reviewed the patient's current medications.  Results for orders placed or performed during the hospital encounter of 02/12/20 (from the past 48 hour(s))  CBC with Differential     Status: None   Collection Time: 02/12/20  2:15 PM  Result Value Ref Range   WBC 9.2 4.0 - 10.5 K/uL   RBC 4.67 3.87 - 5.11 MIL/uL   Hemoglobin 14.2 12.0 - 15.0 g/dL   HCT 16.1 09.6 - 04.5 %   MCV 91.6 80.0 - 100.0 fL   MCH 30.4 26.0 - 34.0 pg   MCHC 33.2 30.0 - 36.0 g/dL   RDW 40.9 81.1 - 91.4 %   Platelets 325 150 - 400 K/uL   nRBC 0.0 0.0 - 0.2 %   Neutrophils Relative % 77 %   Neutro Abs 7.1 1.7 - 7.7 K/uL   Lymphocytes Relative 20 %   Lymphs Abs 1.8 0.7 - 4.0 K/uL   Monocytes Relative 3 %   Monocytes Absolute 0.3 0.1 - 1.0 K/uL   Eosinophils Relative 0 %   Eosinophils Absolute 0.0 0.0 - 0.5 K/uL   Basophils Relative 0 %   Basophils Absolute 0.0 0.0 -  0.1 K/uL   Immature Granulocytes 0 %   Abs Immature Granulocytes 0.04 0.00 - 0.07 K/uL    Comment: Performed at [facility] [place], [address] [place], [place] [address]  Comprehensive metabolic panel     Status: Abnormal   Collection Time: 02/12/20  2:15 PM  Result Value Ref Range   Sodium 136 135 - 145 mmol/L   Potassium 4.4 3.5 - 5.1 mmol/L   Chloride 100 98 - 111 mmol/L   CO2 25 22 - 32 mmol/L   Glucose, Bld 127 (H) 70 - 99 mg/dL    Comment: Glucose reference range applies only to samples taken after fasting for at least 8 hours.   BUN 17 6 - 20 mg/dL   Creatinine, Ser 6.041.15 (H) 0.44 - 1.00 mg/dL   Calcium 9.4 8.9 - 54.010.3 mg/dL   Total Protein 8.7 (H) 6.5 - 8.1 g/dL   Albumin 4.6 3.5 - 5.0 g/dL   AST 20 15 - 41 U/L   ALT 18 0 - 44 U/L   Alkaline Phosphatase 56 38 - 126 U/L   Total Bilirubin 0.3 0.3 - 1.2 mg/dL   GFR calc non Af Amer 57 (L) >60  mL/min   GFR calc Af Amer >60 >60 mL/min   Anion gap 11 5 - 15    Comment: Performed at [facility] [place], [address] [place], [place] [address]  Lipase, blood     Status: None   Collection Time: 02/12/20  2:15 PM  Result Value Ref Range   Lipase 23 11 - 51 U/L    Comment: Performed at [facility] [place], [address] [place], [place] [address]  Urinalysis, Routine w reflex microscopic     Status: Abnormal   Collection Time: 02/12/20  2:15 PM  Result Value Ref Range   Color, Urine YELLOW YELLOW   APPearance CLEAR CLEAR   Specific Gravity, Urine 1.010 1.005 - 1.030   pH 7.5 5.0 - 8.0   Glucose, UA NEGATIVE NEGATIVE mg/dL   Hgb urine dipstick TRACE (A) NEGATIVE   Bilirubin Urine NEGATIVE NEGATIVE   Ketones, ur NEGATIVE NEGATIVE mg/dL   Protein, ur NEGATIVE NEGATIVE mg/dL   Nitrite NEGATIVE NEGATIVE   Leukocytes,Ua NEGATIVE NEGATIVE    Comment: Performed at [facility] [place], [address] [place], [place] [address]  SARS Coronavirus 2 by RT PCR (hospital order, performed in [facility] hospital lab) Nasopharyngeal Nasopharyngeal Swab     Status: None   Collection Time: 02/12/20  2:15 PM   Specimen: Nasopharyngeal Swab  Result Value Ref Range   SARS Coronavirus 2 NEGATIVE NEGATIVE    Comment: (NOTE) SARS-CoV-2 target nucleic acids are NOT DETECTED. The SARS-CoV-2 RNA is generally detectable in upper and lower respiratory specimens during the acute phase of infection. The lowest concentration of SARS-CoV-2 viral copies this assay can detect is 250 copies / mL. A negative result does not preclude SARS-CoV-2 infection and should not be used as the sole basis for treatment or other patient management decisions.  A negative result may occur with improper specimen collection / handling, submission of specimen other than nasopharyngeal swab, presence of viral mutation(s) within the areas targeted by this assay, and inadequate number of viral copies (<250  copies / mL). A negative result must be combined with clinical observations, patient history, and epidemiological information. Fact Sheet for Patients:   BoilerBrush.com.cyhttps://www.fda.gov/media/136312/download Fact Sheet for Healthcare Providers: https://pope.com/https://www.fda.gov/media/136313/download This test is not yet  approved or cleared  by the [place] and has been authorized for detection and/or diagnosis of SARS-CoV-2 by FDA under an Emergency Use Authorization (EUA).  This EUA will remain in effect (meaning this test can be used) for the duration of the COVID-19 declaration under Section 564(b)(1) of the Act, 21 U.S.C. section 360bbb-3(b)(1), unless the authorization is terminated or revoked sooner. Performed at [facility], [address] [place], [place] [address]   Urinalysis, Microscopic (reflex)     Status: Abnormal   Collection Time: 02/12/20  2:15 PM  Result Value Ref Range   RBC / HPF 0-5 0 - 5 RBC/hpf   WBC, UA 0-5 0 - 5 WBC/hpf   Bacteria, UA FEW (A) NONE SEEN   Squamous Epithelial / LPF 0-5 0 - 5    Comment: Performed at [facility], [address] [place], [place] [address]  Glucose, capillary     Status: Abnormal   Collection Time: 02/13/20 12:29 AM  Result Value Ref Range   Glucose-Capillary 166 (H) 70 - 99 mg/dL    Comment: Glucose reference range applies only to samples taken after fasting for at least 8 hours.  HIV Antibody (routine testing w rflx)     Status: None   Collection Time: 02/13/20  3:26 AM  Result Value Ref Range   HIV Screen 4th Generation wRfx Non Reactive Non Reactive    Comment: Performed at [facility], [place] [address], [place], [place] [address]  Basic metabolic panel     Status: Abnormal   Collection Time: 02/13/20  3:26 AM  Result Value Ref Range   Sodium 138 135 - 145 mmol/L   Potassium 3.9 3.5 - 5.1 mmol/L   Chloride 105 98 - 111 mmol/L   CO2 25 22 - 32 mmol/L   Glucose, Bld 160 (H) 70 - 99 mg/dL    Comment:  Glucose reference range applies only to samples taken after fasting for at least 8 hours.   BUN 17 6 - 20 mg/dL   Creatinine, Ser 0.96 0.44 - 1.00 mg/dL   Calcium 8.7 (L) 8.9 - 10.3 mg/dL   GFR calc non Af Amer >60 >60 mL/min   GFR calc Af Amer >60 >60 mL/min   Anion gap 8 5 - 15    Comment: Performed at [facility], [place] [address] [place], [place] [address]  CBC     Status: None   Collection Time: 02/13/20  3:26 AM  Result Value Ref Range   WBC 8.5 4.0 - 10.5 K/uL   RBC 4.15 3.87 - 5.11 MIL/uL   Hemoglobin 12.7 12.0 - 15.0 g/dL   HCT 39.1 36.0 - 46.0 %   MCV 94.2 80.0 - 100.0 fL   MCH 30.6 26.0 - 34.0 pg   MCHC 32.5 30.0 - 36.0 g/dL   RDW 12.6 11.5 - 15.5 %   Platelets 305 150 - 400 K/uL   nRBC 0.0 0.0 - 0.2 %    Comment: Performed at [facility], [place] [address], [place], [place] [address]  Glucose, capillary     Status: Abnormal   Collection Time: 02/13/20  7:33 AM  Result Value Ref Range   Glucose-Capillary 124 (H) 70 - 99 mg/dL    Comment: Glucose reference range applies only to samples taken after fasting for at least 8 hours.    DG Abdomen 1 View  Result Date: 02/12/2020 CLINICAL DATA:  NG tube  placement EXAM: ABDOMEN - 1 VIEW COMPARISON:  09/25/2016, CT 02/12/2020 FINDINGS: Esophageal tube side-port over the distal esophagus. Contrast within the renal collecting systems. IMPRESSION: Esophageal tube tip and side port overlie distal esophagus/GE junction, recommend further advancement by at least 5-10 cm for more optimal positioning. Electronically Signed   By: Jasmine Pang M.D.   On: 02/12/2020 18:45   CT ABDOMEN PELVIS W CONTRAST  Result Date: 02/12/2020 CLINICAL DATA:  Nausea, vomiting EXAM: CT ABDOMEN AND PELVIS WITH CONTRAST TECHNIQUE: Multidetector CT imaging of the abdomen and pelvis was performed using the standard protocol following bolus administration of intravenous contrast. CONTRAST:  OMNIPAQUE IOHEXOL 300 MG/ML  SOLN  COMPARISON:  2017 FINDINGS: Lower chest: No acute abnormality. Hepatobiliary: Too small to characterize hypoattenuating lesions of the right hepatic lobe. Gallbladder is unremarkable. Pancreas: Unremarkable. Spleen: Unremarkable. Adrenals/Urinary Tract: Adrenals, kidneys, and bladder are unremarkable. Stomach/Bowel: Small hiatal hernia. Stomach is otherwise within normal limits. There are several fluid-filled dilated loops of small bowel. Transition point appears to be in the pelvis ventral to the bladder (series 2, image 69) with more distal ileum demonstrating decompressed appearance. Proximal extent of dilatation is within the left quadrant with nondilated proximal small bowel seen beginning on series 5, image 34. Normal appendix. Vascular/Lymphatic: No significant vascular findings are present. No enlarged abdominal or pelvic lymph nodes. Reproductive: Status post hysterectomy. No adnexal masses. Other: Postoperative changes in the ventral abdominal wall. No ascites. Musculoskeletal: No acute osseous abnormality. IMPRESSION: Distal small-bowel obstruction with suspected transition point in the pelvis anterior to the bladder. Likely same transition point seen on 2017 study. Electronically Signed   By: Guadlupe Spanish M.D.   On: 02/12/2020 16:11    ROS - all of the below systems have been reviewed with the patient and positives are indicated with bold text General: chills, fever or night sweats Eyes: blurry vision or double vision ENT: epistaxis or sore throat, +still with smell issues Allergy/Immunology: itchy/watery eyes or nasal congestion Hematologic/Lymphatic: bleeding problems, blood clots or swollen lymph nodes Endocrine: temperature intolerance or unexpected weight changes Breast: new or changing breast lumps or nipple discharge Resp: cough, shortness of breath, or wheezing CV: chest pain or dyspnea on exertion GI: as per HPI GU: dysuria, trouble voiding, or hematuria MSK: joint pain or joint  stiffness Neuro: TIA or stroke symptoms Derm: pruritus and skin lesion changes Psych: anxiety and depression  PE Blood pressure (!) 179/88, pulse 83, temperature 98.3 F (36.8 C), temperature source Oral, resp. rate 14, height 5\' 9"  (1.753 m), weight 86.2 kg, SpO2 100 %. Constitutional: NAD; conversant; no deformities Eyes: Moist conjunctiva; no lid lag; anicteric; PERRL Neck: Trachea midline; no thyromegaly Lungs: Normal respiratory effort; no tactile fremitus CV: RRR; no palpable thrills; no pitting edema GI: Abd soft, mild distension, old incisions, no hernia. Mild TTP. No rebound/guarding; no palpable hepatosplenomegaly MSK:  no clubbing/cyanosis Psychiatric: Appropriate affect; alert and oriented x3 Lymphatic: No palpable cervical or axillary lymphadenopathy Skin: no rash, induration, lesions. No nodules  Results for orders placed or performed during the hospital encounter of 02/12/20 (from the past 48 hour(s))  CBC with Differential     Status: None   Collection Time: 02/12/20  2:15 PM  Result Value Ref Range   WBC 9.2 4.0 - 10.5 K/uL   RBC 4.67 3.87 - 5.11 MIL/uL   Hemoglobin 14.2 12.0 - 15.0 g/dL   HCT 04/13/20 31.4 - 97.0 %   MCV 91.6 80.0 - 100.0 fL   MCH 30.4  26.0 - 34.0 pg   MCHC 33.2 30.0 - 36.0 g/dL   RDW 86.5 78.4 - 69.6 %   Platelets 325 150 - 400 K/uL   nRBC 0.0 0.0 - 0.2 %   Neutrophils Relative % 77 %   Neutro Abs 7.1 1.7 - 7.7 K/uL   Lymphocytes Relative 20 %   Lymphs Abs 1.8 0.7 - 4.0 K/uL   Monocytes Relative 3 %   Monocytes Absolute 0.3 0.1 - 1.0 K/uL   Eosinophils Relative 0 %   Eosinophils Absolute 0.0 0.0 - 0.5 K/uL   Basophils Relative 0 %   Basophils Absolute 0.0 0.0 - 0.1 K/uL   Immature Granulocytes 0 %   Abs Immature Granulocytes 0.04 0.00 - 0.07 K/uL    Comment: Performed at [facility], [address] [place], [place] [address]  Comprehensive metabolic panel     Status: Abnormal   Collection Time: 02/12/20  2:15 PM  Result  Value Ref Range   Sodium 136 135 - 145 mmol/L   Potassium 4.4 3.5 - 5.1 mmol/L   Chloride 100 98 - 111 mmol/L   CO2 25 22 - 32 mmol/L   Glucose, Bld 127 (H) 70 - 99 mg/dL    Comment: Glucose reference range applies only to samples taken after fasting for at least 8 hours.   BUN 17 6 - 20 mg/dL   Creatinine, Ser 4.13 (H) 0.44 - 1.00 mg/dL   Calcium 9.4 8.9 - 24.4 mg/dL   Total Protein 8.7 (H) 6.5 - 8.1 g/dL   Albumin 4.6 3.5 - 5.0 g/dL   AST 20 15 - 41 U/L   ALT 18 0 - 44 U/L   Alkaline Phosphatase 56 38 - 126 U/L   Total Bilirubin 0.3 0.3 - 1.2 mg/dL   GFR calc non Af Amer 57 (L) >60 mL/min   GFR calc Af Amer >60 >60 mL/min   Anion gap 11 5 - 15    Comment: Performed at [facility], [address] [place], [place] [address]  Lipase, blood     Status: None   Collection Time: 02/12/20  2:15 PM  Result Value Ref Range   Lipase 23 11 - 51 U/L    Comment: Performed at [facility], [address] [place], [place] [address]  Urinalysis, Routine w reflex microscopic     Status: Abnormal   Collection Time: 02/12/20  2:15 PM  Result Value Ref Range   Color, Urine YELLOW YELLOW   APPearance CLEAR CLEAR   Specific Gravity, Urine 1.010 1.005 - 1.030   pH 7.5 5.0 - 8.0   Glucose, UA NEGATIVE NEGATIVE mg/dL   Hgb urine dipstick TRACE (A) NEGATIVE   Bilirubin Urine NEGATIVE NEGATIVE   Ketones, ur NEGATIVE NEGATIVE mg/dL   Protein, ur NEGATIVE NEGATIVE mg/dL   Nitrite NEGATIVE NEGATIVE   Leukocytes,Ua NEGATIVE NEGATIVE    Comment: Performed at [facility], [address] [place], [place] [address]  SARS Coronavirus 2 by RT PCR (hospital order, performed in [facility] hospital lab) Nasopharyngeal Nasopharyngeal Swab     Status: None   Collection Time: 02/12/20  2:15 PM   Specimen: Nasopharyngeal Swab  Result Value Ref Range   SARS Coronavirus 2 NEGATIVE NEGATIVE    Comment: (NOTE) SARS-CoV-2 target nucleic acids are NOT DETECTED. The SARS-CoV-2 RNA  is generally detectable in upper and lower respiratory specimens during the acute phase of infection. The lowest  concentration of SARS-CoV-2 viral copies this assay can detect is 250 copies / mL. A negative result does not preclude SARS-CoV-2 infection and should not be used as the sole basis for treatment or other patient management decisions.  A negative result may occur with improper specimen collection / handling, submission of specimen other than nasopharyngeal swab, presence of viral mutation(s) within the areas targeted by this assay, and inadequate number of viral copies (<250 copies / mL). A negative result must be combined with clinical observations, patient history, and epidemiological information. Fact Sheet for Patients:   BoilerBrush.com.cy Fact Sheet for Healthcare Providers: https://pope.com/ This test is not yet approved or cleared  by the [place] FDA and has been authorized for detection and/or diagnosis of SARS-CoV-2 by FDA under an Emergency Use Authorization (EUA).  This EUA will remain in effect (meaning this test can be used) for the duration of the COVID-19 declaration under Section 564(b)(1) of the Act, 21 U.S.C. section 360bbb-3(b)(1), unless the authorization is terminated or revoked sooner. Performed at [facility], [address] [place], [place] [address]   Urinalysis, Microscopic (reflex)     Status: Abnormal   Collection Time: 02/12/20  2:15 PM  Result Value Ref Range   RBC / HPF 0-5 0 - 5 RBC/hpf   WBC, UA 0-5 0 - 5 WBC/hpf   Bacteria, UA FEW (A) NONE SEEN   Squamous Epithelial / LPF 0-5 0 - 5    Comment: Performed at [facility], [address] [place], [place] [address]  Glucose, capillary     Status: Abnormal   Collection Time: 02/13/20 12:29 AM  Result Value Ref Range   Glucose-Capillary 166 (H) 70 - 99 mg/dL    Comment: Glucose reference range applies only to  samples taken after fasting for at least 8 hours.  HIV Antibody (routine testing w rflx)     Status: None   Collection Time: 02/13/20  3:26 AM  Result Value Ref Range   HIV Screen 4th Generation wRfx Non Reactive Non Reactive    Comment: Performed at [facility], [address], [place], [place] [address]  Basic metabolic panel     Status: Abnormal   Collection Time: 02/13/20  3:26 AM  Result Value Ref Range   Sodium 138 135 - 145 mmol/L   Potassium 3.9 3.5 - 5.1 mmol/L   Chloride 105 98 - 111 mmol/L   CO2 25 22 - 32 mmol/L   Glucose, Bld 160 (H) 70 - 99 mg/dL    Comment: Glucose reference range applies only to samples taken after fasting for at least 8 hours.   BUN 17 6 - 20 mg/dL   Creatinine, Ser 8.50 0.44 - 1.00 mg/dL   Calcium 8.7 (L) 8.9 - 10.3 mg/dL   GFR calc non Af Amer >60 >60 mL/min   GFR calc Af Amer >60 >60 mL/min   Anion gap 8 5 - 15    Comment: Performed at [facility], [address] [place], [place] [address]  CBC     Status: None   Collection Time: 02/13/20  3:26 AM  Result Value Ref Range   WBC 8.5 4.0 - 10.5 K/uL   RBC 4.15 3.87 - 5.11 MIL/uL   Hemoglobin 12.7 12.0 - 15.0 g/dL   HCT 28.7 86.7 - 67.2 %   MCV 94.2 80.0 - 100.0 fL   MCH 30.6 26.0 - 34.0 pg   MCHC 32.5 30.0 -  36.0 g/dL   RDW 40.9 81.1 - 91.4 %   Platelets 305 150 - 400 K/uL   nRBC 0.0 0.0 - 0.2 %    Comment: Performed at [facility], [address] [place], [place] [address]  Glucose, capillary     Status: Abnormal   Collection Time: 02/13/20  7:33 AM  Result Value Ref Range   Glucose-Capillary 124 (H) 70 - 99 mg/dL    Comment: Glucose reference range applies only to samples taken after fasting for at least 8 hours.    DG Abdomen 1 View  Result Date: 02/12/2020 CLINICAL DATA:  NG tube placement EXAM: ABDOMEN - 1 VIEW COMPARISON:  09/25/2016, CT 02/12/2020 FINDINGS: Esophageal tube side-port over the distal esophagus. Contrast within the renal  collecting systems. IMPRESSION: Esophageal tube tip and side port overlie distal esophagus/GE junction, recommend further advancement by at least 5-10 cm for more optimal positioning. Electronically Signed   By: Jasmine Pang M.D.   On: 02/12/2020 18:45   CT ABDOMEN PELVIS W CONTRAST  Result Date: 02/12/2020 CLINICAL DATA:  Nausea, vomiting EXAM: CT ABDOMEN AND PELVIS WITH CONTRAST TECHNIQUE: Multidetector CT imaging of the abdomen and pelvis was performed using the standard protocol following bolus administration of intravenous contrast. CONTRAST:  OMNIPAQUE IOHEXOL 300 MG/ML  SOLN COMPARISON:  2017 FINDINGS: Lower chest: No acute abnormality. Hepatobiliary: Too small to characterize hypoattenuating lesions of the right hepatic lobe. Gallbladder is unremarkable. Pancreas: Unremarkable. Spleen: Unremarkable. Adrenals/Urinary Tract: Adrenals, kidneys, and bladder are unremarkable. Stomach/Bowel: Small hiatal hernia. Stomach is otherwise within normal limits. There are several fluid-filled dilated loops of small bowel. Transition point appears to be in the pelvis ventral to the bladder (series 2, image 69) with more distal ileum demonstrating decompressed appearance. Proximal extent of dilatation is within the left quadrant with nondilated proximal small bowel seen beginning on series 5, image 34. Normal appendix. Vascular/Lymphatic: No significant vascular findings are present. No enlarged abdominal or pelvic lymph nodes. Reproductive: Status post hysterectomy. No adnexal masses. Other: Postoperative changes in the ventral abdominal wall. No ascites. Musculoskeletal: No acute osseous abnormality. IMPRESSION: Distal small-bowel obstruction with suspected transition point in the pelvis anterior to the bladder. Likely same transition point seen on 2017 study. Electronically Signed   By: Guadlupe Spanish M.D.   On: 02/12/2020 16:11    Imaging: personally  A/P: Renee Murphy is an 49 y.o. female with  nausea/vomiting/abdominal pain pSBO Mild AKI H/o multiple prior abdominal surgeries Post Covid 19 viral syndrome Overweight Hypertension  I personally reviewed her CT scan.  There is no concerning features such as pneumatosis or bowel wall thickening.  She has no fever.  Her abdominal exam is very reassuring.  We discussed small bowel obstructions.  I believe this can be managed nonsurgically.  She has family at the bedside.  We will start with our small bowel obstruction protocol I discussed this with the patient.  Mary Sella. Andrey Campanile, MD, FACS General, Bariatric, & Minimally Invasive Surgery [facility] Surgery, [place]

## 2020-02-13 NOTE — Progress Notes (Signed)
PROGRESS NOTE    Renee Murphy  ZTI:[id number] DOB: 1971-08-05 DOA: 02/12/2020 PCP: Robyne Peers, MD   Brief Narrative:  49 year old with history of hysterectomy, hernia repair, urethral surgery, small bowel obstructions presented to [facility] with complaints of abdominal pain.  CT showed small bowel obstruction with transition point.  NG tube placed, general surgery consulted.   Assessment & Plan:   Principal Problem:   SBO (small bowel obstruction) (HCC)  Small bowel obstruction, distal with transition point in pelvis anterior to bladder History of multiple abdominal surgeries -General surgery following, no urgent surgical indication.  Small bowel obstruction protocol started. -Continue Accu-Cheks, IV fluids, supportive care  History of COVID-19 infection -Supportive care   DVT prophylaxis: None indicated patient is still having Code Status: Full Family Communication: Family  at bedside  Status is: Inpatient  Remains inpatient appropriate because:IV treatments appropriate due to intensity of illness or inability to take PO   Dispo: The patient is from: Home              Anticipated d/c is to: Home              Anticipated d/c date is: 2 days              Patient currently is not medically stable to d/c.  Patient is small bowel obstruction, currently NPO.  Require surgical consultation with possible intervention.    Subjective: Feels slightly better, abdomen is less distended.  Denies any nausea or vomiting.  Abdomen still tender to deep palpation.  Review of Systems Otherwise negative except as per HPI, including: General: Denies fever, chills, night sweats or unintended weight loss. Resp: Denies cough, wheezing, shortness of breath. Cardiac: Denies chest pain, palpitations, orthopnea, paroxysmal nocturnal dyspnea. GI: Denies , vomiting, diarrhea or constipation GU: Denies dysuria, frequency, hesitancy or incontinence MS: Denies muscle aches,  joint pain or swelling Neuro: Denies headache, neurologic deficits (focal weakness, numbness, tingling), abnormal gait Psych: Denies anxiety, depression, SI/HI/AVH Skin: Denies new rashes or lesions ID: Denies sick contacts, exotic exposures, travel  Examination:  General exam: Appears calm and comfortable, NG tube in place Respiratory system: Clear to auscultation. Respiratory effort normal. Cardiovascular system: S1 & S2 heard, RRR. No JVD, murmurs, rubs, gallops or clicks. No pedal edema. Gastrointestinal system: soft, slightly tender to deep palpation Central nervous system: Alert and oriented. No focal neurological deficits. Extremities: Symmetric 5 x 5 power. Skin: No rashes, lesions or ulcers Psychiatry: Judgement and insight appear normal. Mood & affect appropriate.     Objective: Vitals:   02/12/20 2148 02/13/20 0152 02/13/20 0306 02/13/20 0535  BP: (!) 163/84 (!) 181/87 (!) 147/92 (!) 166/87  Pulse: 71 78 71 73  Resp: 16 16  16   Temp: 97.8 F (36.6 C) 97.9 F (36.6 C)  97.8 F (36.6 C)  TempSrc: Oral Oral    SpO2: 100% 100% 100% 100%  Weight:      Height:        Intake/Output Summary (Last 24 hours) at 02/13/2020 0836 Last data filed at 02/13/2020 0600 Gross per 24 hour  Intake 1710.21 ml  Output 400 ml  Net 1310.21 ml   Filed Weights   02/12/20 2030  Weight: 86.2 kg     Data Reviewed:   CBC: Recent Labs  Lab 02/12/20 1415 02/13/20 0326  WBC 9.2 8.5  NEUTROABS 7.1  --   HGB 14.2 12.7  HCT 42.8 39.1  MCV 91.6 94.2  PLT 325 305  Basic Metabolic Panel: Recent Labs  Lab 02/12/20 1415 02/13/20 0326  NA 136 138  K 4.4 3.9  CL 100 105  CO2 25 25  GLUCOSE 127* 160*  BUN 17 17  CREATININE 1.15* 0.96  CALCIUM 9.4 8.7*   GFR: Estimated Creatinine Clearance: 84.9 mL/min (by C-G formula based on SCr of 0.96 mg/dL). Liver Function Tests: Recent Labs  Lab 02/12/20 1415  AST 20  ALT 18  ALKPHOS 56  BILITOT 0.3  PROT 8.7*  ALBUMIN 4.6    Recent Labs  Lab 02/12/20 1415  LIPASE 23   No results for input(s): AMMONIA in the last 168 hours. Coagulation Profile: No results for input(s): INR, PROTIME in the last 168 hours. Cardiac Enzymes: No results for input(s): CKTOTAL, CKMB, CKMBINDEX, TROPONINI in the last 168 hours. BNP (last 3 results) No results for input(s): PROBNP in the last 8760 hours. HbA1C: No results for input(s): HGBA1C in the last 72 hours. CBG: Recent Labs  Lab 02/13/20 0029 02/13/20 0733  GLUCAP 166* 124*   Lipid Profile: No results for input(s): CHOL, HDL, LDLCALC, TRIG, CHOLHDL, LDLDIRECT in the last 72 hours. Thyroid Function Tests: No results for input(s): TSH, T4TOTAL, FREET4, T3FREE, THYROIDAB in the last 72 hours. Anemia Panel: No results for input(s): VITAMINB12, FOLATE, FERRITIN, TIBC, IRON, RETICCTPCT in the last 72 hours. Sepsis Labs: No results for input(s): PROCALCITON, LATICACIDVEN in the last 168 hours.  Recent Results (from the past 240 hour(s))  SARS Coronavirus 2 by RT PCR (hospital order, performed in [facility] hospital lab) Nasopharyngeal Nasopharyngeal Swab     Status: None   Collection Time: 02/12/20  2:15 PM   Specimen: Nasopharyngeal Swab  Result Value Ref Range Status   SARS Coronavirus 2 NEGATIVE NEGATIVE Final    Comment: (NOTE) SARS-CoV-2 target nucleic acids are NOT DETECTED. The SARS-CoV-2 RNA is generally detectable in upper and lower respiratory specimens during the acute phase of infection. The lowest concentration of SARS-CoV-2 viral copies this assay can detect is 250 copies / mL. A negative result does not preclude SARS-CoV-2 infection and should not be used as the sole basis for treatment or other patient management decisions.  A negative result may occur with improper specimen collection / handling, submission of specimen other than nasopharyngeal swab, presence of viral mutation(s) within the areas targeted by this assay, and inadequate number of  viral copies (<250 copies / mL). A negative result must be combined with clinical observations, patient history, and epidemiological information. Fact Sheet for Patients:   BoilerBrush.com.cy Fact Sheet for Healthcare Providers: https://pope.com/ This test is not yet approved or cleared  by the [place] FDA and has been authorized for detection and/or diagnosis of SARS-CoV-2 by FDA under an Emergency Use Authorization (EUA).  This EUA will remain in effect (meaning this test can be used) for the duration of the COVID-19 declaration under Section 564(b)(1) of the Act, 21 U.S.C. section 360bbb-3(b)(1), unless the authorization is terminated or revoked sooner. Performed at [facility], [address] [place], [place] [address]          Radiology Studies: DG Abdomen 1 View  Result Date: 02/12/2020 CLINICAL DATA:  NG tube placement EXAM: ABDOMEN - 1 VIEW COMPARISON:  09/25/2016, CT 02/12/2020 FINDINGS: Esophageal tube side-port over the distal esophagus. Contrast within the renal collecting systems. IMPRESSION: Esophageal tube tip and side port overlie distal esophagus/GE junction, recommend further advancement by at least 5-10 cm for more optimal positioning. Electronically Signed  By: Jasmine Pang M.D.   On: 02/12/2020 18:45   CT ABDOMEN PELVIS W CONTRAST  Result Date: 02/12/2020 CLINICAL DATA:  Nausea, vomiting EXAM: CT ABDOMEN AND PELVIS WITH CONTRAST TECHNIQUE: Multidetector CT imaging of the abdomen and pelvis was performed using the standard protocol following bolus administration of intravenous contrast. CONTRAST:  OMNIPAQUE IOHEXOL 300 MG/ML  SOLN COMPARISON:  2017 FINDINGS: Lower chest: No acute abnormality. Hepatobiliary: Too small to characterize hypoattenuating lesions of the right hepatic lobe. Gallbladder is unremarkable. Pancreas: Unremarkable. Spleen: Unremarkable. Adrenals/Urinary Tract: Adrenals,  kidneys, and bladder are unremarkable. Stomach/Bowel: Small hiatal hernia. Stomach is otherwise within normal limits. There are several fluid-filled dilated loops of small bowel. Transition point appears to be in the pelvis ventral to the bladder (series 2, image 69) with more distal ileum demonstrating decompressed appearance. Proximal extent of dilatation is within the left quadrant with nondilated proximal small bowel seen beginning on series 5, image 34. Normal appendix. Vascular/Lymphatic: No significant vascular findings are present. No enlarged abdominal or pelvic lymph nodes. Reproductive: Status post hysterectomy. No adnexal masses. Other: Postoperative changes in the ventral abdominal wall. No ascites. Musculoskeletal: No acute osseous abnormality. IMPRESSION: Distal small-bowel obstruction with suspected transition point in the pelvis anterior to the bladder. Likely same transition point seen on 2017 study. Electronically Signed   By: Guadlupe Spanish M.D.   On: 02/12/2020 16:11        Scheduled Meds: Continuous Infusions: . dextrose 5 % and 0.9% NaCl 100 mL/hr at 02/13/20 0600     LOS: 1 day   Time spent= 35 mins    Orvan Papadakis Joline Maxcy, MD [facility]  If 7PM-7AM, please contact night-coverage  02/13/2020, 8:36 AM

## 2020-02-14 LAB — COMPREHENSIVE METABOLIC PANEL
ALT: 13 U/L (ref 0–44)
AST: 16 U/L (ref 15–41)
Albumin: 3.9 g/dL (ref 3.5–5.0)
Alkaline Phosphatase: 45 U/L (ref 38–126)
Anion gap: 8 (ref 5–15)
BUN: 11 mg/dL (ref 6–20)
CO2: 26 mmol/L (ref 22–32)
Calcium: 8.6 mg/dL — ABNORMAL LOW (ref 8.9–10.3)
Chloride: 104 mmol/L (ref 98–111)
Creatinine, Ser: 1 mg/dL (ref 0.44–1.00)
GFR calc Af Amer: >60 mL/min (ref >60)
GFR calc non Af Amer: >60 mL/min (ref >60)
Glucose, Bld: 141 mg/dL — ABNORMAL HIGH (ref 70–99)
Potassium: 3.6 mmol/L (ref 3.5–5.1)
Sodium: 138 mmol/L (ref 135–145)
Total Bilirubin: 0.5 mg/dL (ref 0.3–1.2)
Total Protein: 7.1 g/dL (ref 6.5–8.1)

## 2020-02-14 LAB — GLUCOSE, CAPILLARY
Glucose-Capillary: 105 mg/dL — ABNORMAL HIGH (ref 70–99)
Glucose-Capillary: 126 mg/dL — ABNORMAL HIGH (ref 70–99)
Glucose-Capillary: 128 mg/dL — ABNORMAL HIGH (ref 70–99)

## 2020-02-14 LAB — CBC
HCT: 39.6 % (ref 36.0–46.0)
Hemoglobin: 12.7 g/dL (ref 12.0–15.0)
MCH: 30.3 pg (ref 26.0–34.0)
MCHC: 32.1 g/dL (ref 30.0–36.0)
MCV: 94.5 fL (ref 80.0–100.0)
Platelets: 304 10*3/uL (ref 150–400)
RBC: 4.19 MIL/uL (ref 3.87–5.11)
RDW: 12.7 % (ref 11.5–15.5)
WBC: 8.2 10*3/uL (ref 4.0–10.5)
nRBC: 0 % (ref 0.0–0.2)

## 2020-02-14 LAB — MAGNESIUM: Magnesium: 2.4 mg/dL (ref 1.7–2.4)

## 2020-02-14 MED ORDER — DEXTROSE-NACL 5-0.45 % IV SOLN: INTRAVENOUS | Status: DC

## 2020-02-14 MED ORDER — POTASSIUM CHLORIDE 10 MEQ/100ML IV SOLN
10.0000 meq | INTRAVENOUS | Status: AC
  Administered 2020-02-14 (×4): 10 meq via INTRAVENOUS
  Filled 2020-02-14 (×4): qty 100

## 2020-02-14 MED ORDER — HYDRALAZINE HCL 20 MG/ML IJ SOLN: 10.0000 mg | INTRAMUSCULAR | Status: DC | PRN

## 2020-02-14 NOTE — Plan of Care (Signed)

## 2020-02-14 NOTE — Progress Notes (Signed)
Subjective/Chief Complaint: Reports no abdominal pain this morning No flatus or BM NG put out 750 last 24 hours   Objective: Vital signs in last 24 hours: Temp:  [98.2 F (36.8 C)-98.5 F (36.9 C)] 98.4 F (36.9 C) (06/06 0547) Pulse Rate:  [66-83] 74 (06/06 0547) Resp:  [14-15] 15 (06/05 2255) BP: (153-179)/(88-93) 154/93 (06/06 0547) SpO2:  [99 %-100 %] 100 % (06/06 0547) Last BM Date: 02/12/20  Intake/Output from previous day: 06/05 0701 - 06/06 0700 In: 1198.7 [I.V.:1198.7] Out: 750 [Emesis/NG output:750] Intake/Output this shift: No intake/output data recorded.  Exam: Awake and alert Looks comfortable Abdomen still a little full but soft, no peritoneal signs  Lab Results:  Recent Labs    02/13/20 0326 02/14/20 0359  WBC 8.5 8.2  HGB 12.7 12.7  HCT 39.1 39.6  PLT 305 304   BMET Recent Labs    02/13/20 0326 02/14/20 0359  NA 138 138  K 3.9 3.6  CL 105 104  CO2 25 26  GLUCOSE 160* 141*  BUN 17 11  CREATININE 0.96 1.00  CALCIUM 8.7* 8.6*   PT/INR No results for input(s): LABPROT, INR in the last 72 hours. ABG No results for input(s): PHART, HCO3 in the last 72 hours.  Invalid input(s): PCO2, PO2  Studies/Results: DG Abdomen 1 View  Result Date: 02/12/2020 CLINICAL DATA:  NG tube placement EXAM: ABDOMEN - 1 VIEW COMPARISON:  09/25/2016, CT 02/12/2020 FINDINGS: Esophageal tube side-port over the distal esophagus. Contrast within the renal collecting systems. IMPRESSION: Esophageal tube tip and side port overlie distal esophagus/GE junction, recommend further advancement by at least 5-10 cm for more optimal positioning. Electronically Signed   By: Jasmine Pang M.D.   On: 02/12/2020 18:45   CT ABDOMEN PELVIS W CONTRAST  Result Date: 02/12/2020 CLINICAL DATA:  Nausea, vomiting EXAM: CT ABDOMEN AND PELVIS WITH CONTRAST TECHNIQUE: Multidetector CT imaging of the abdomen and pelvis was performed using the standard protocol following bolus  administration of intravenous contrast. CONTRAST:  OMNIPAQUE IOHEXOL 300 MG/ML  SOLN COMPARISON:  2017 FINDINGS: Lower chest: No acute abnormality. Hepatobiliary: Too small to characterize hypoattenuating lesions of the right hepatic lobe. Gallbladder is unremarkable. Pancreas: Unremarkable. Spleen: Unremarkable. Adrenals/Urinary Tract: Adrenals, kidneys, and bladder are unremarkable. Stomach/Bowel: Small hiatal hernia. Stomach is otherwise within normal limits. There are several fluid-filled dilated loops of small bowel. Transition point appears to be in the pelvis ventral to the bladder (series 2, image 69) with more distal ileum demonstrating decompressed appearance. Proximal extent of dilatation is within the left quadrant with nondilated proximal small bowel seen beginning on series 5, image 34. Normal appendix. Vascular/Lymphatic: No significant vascular findings are present. No enlarged abdominal or pelvic lymph nodes. Reproductive: Status post hysterectomy. No adnexal masses. Other: Postoperative changes in the ventral abdominal wall. No ascites. Musculoskeletal: No acute osseous abnormality. IMPRESSION: Distal small-bowel obstruction with suspected transition point in the pelvis anterior to the bladder. Likely same transition point seen on 2017 study. Electronically Signed   By: Guadlupe Spanish M.D.   On: 02/12/2020 16:11   DG Abd Portable 1V-Small Bowel Obstruction Protocol-initial, 8 hr delay  Result Date: 02/13/2020 CLINICAL DATA:  Small bowel protocol to evaluate for small bowel obstruction. EXAM: PORTABLE ABDOMEN - 1 VIEW COMPARISON:  February 13, 2020 (12:05 p.m.) FINDINGS: A nasogastric tube is seen with its distal tip overlying the body of the stomach. Mildly dilated small bowel loops are again seen along the medial aspect of the mid right abdomen. Radiopaque  contrast is seen within the mid and distal portions of the ascending colon and throughout the transverse colon. Small radiopaque surgical  clips are seen overlying the pelvis, bilaterally. No radio-opaque calculi or other significant radiographic abnormality are seen. IMPRESSION: 1. Nasogastric tube positioning, as described above. 2. Persistent mildly dilated small bowel loops along the medial aspect of the mid right abdomen. Electronically Signed   By: Virgina Norfolk M.D.   On: 02/13/2020 23:54   DG Abd Portable 1V-Small Bowel Protocol-Position Verification  Result Date: 02/13/2020 CLINICAL DATA:  Nausea and vomiting, history of bowel obstruction EXAM: PORTABLE ABDOMEN - 1 VIEW COMPARISON:  02/12/2020 FINDINGS: Frontal view of the lower chest and upper abdomen demonstrates enteric catheter tip and side port projecting over gastric body. Mild distension of the small bowel within the central abdomen. No free gas in the greater peritoneal sac. Lung bases are clear. IMPRESSION: 1. Enteric catheter overlying gastric body. Electronically Signed   By: Randa Ngo M.D.   On: 02/13/2020 15:06    Anti-infectives: Anti-infectives (From admission, onward)   None      Assessment/Plan: Partial SBO  Protocol films with contrast in the colon.  Still with some mildly dilated loops of small bowel.  Given that and lack of flatus and NG output, will leave in until tomorrow.  Will allow ice chips Ambulate  If better tomorrow, will pull the NG and start liquids    LOS: 2 days    Coralie Keens 02/14/2020

## 2020-02-14 NOTE — Progress Notes (Signed)
PROGRESS NOTE    Renee Murphy  FFM:[id number] DOB: 19-Jun-1971 DOA: 02/12/2020 PCP: Angelica Chessman, MD   Brief Narrative:  49 year old with history of hysterectomy, hernia repair, urethral surgery, small bowel obstructions presented to med [facility] with complaints of abdominal pain.  CT showed small bowel obstruction with transition point.  NG tube placed, general surgery consulted-recommended conservative management   Assessment & Plan:   Principal Problem:   SBO (small bowel obstruction) (HCC)  Small bowel obstruction, distal with transition point in pelvis anterior to bladder History of multiple abdominal surgeries -Seen by general surgery, conservative management at this time.  Continue IV fluids, Accu-Cheks and supportive care. -Small bowel protocol-persistently mildly dilated small bowel loops  History of COVID-19 infection -Supportive care   DVT prophylaxis: None indicated patient is still having Code Status: Full Family Communication:   Status is: Inpatient  Remains inpatient appropriate because:IV treatments appropriate due to intensity of illness or inability to take PO   Dispo: The patient is from: Home              Anticipated d/c is to: Home              Anticipated d/c date is: 2 days              Patient currently is not medically stable to d/c.  Patient is small bowel obstruction, currently NPO.  NG Tube in place.   Subjective: Slightly better in terms of abd pain.  No bowel movement yet, she is not passing gas yet.  Review of Systems Otherwise negative except as per HPI, including: General = no fevers, chills, dizziness,  fatigue HEENT/EYES = negative for loss of vision, double vision, blurred vision,  sore throa Cardiovascular= negative for chest pain, palpitation Respiratory/lungs= negative for shortness of breath, cough, wheezing; hemoptysis,  Gastrointestinal= negative for nausea, vomiting, abdominal pain Genitourinary= negative  for Dysuria MSK = Negative for arthralgia, myalgias Neurology= Negative for headache, numbness, tingling  Psychiatry= Negative for suicidal and homocidal ideation Skin= Negative for Rash   Examination:  Constitutional: Not in acute distress, NG tube in place Respiratory: Clear to auscultation bilaterally Cardiovascular: Normal sinus rhythm, no rubs Abdomen: Nontender nondistended good bowel sounds Musculoskeletal: No edema noted Skin: No rashes seen Neurologic: CN 2-12 grossly intact.  And nonfocal Psychiatric: Normal judgment and insight. Alert and oriented x 3. Normal mood.     Objective: Vitals:   02/13/20 1001 02/13/20 1406 02/13/20 2255 02/14/20 0547  BP: (!) 179/88 (!) 158/92 (!) 153/92 (!) 154/93  Pulse: 83 66 67 74  Resp: 14 14 15    Temp: 98.3 F (36.8 C) 98.5 F (36.9 C) 98.2 F (36.8 C) 98.4 F (36.9 C)  TempSrc: Oral Oral Oral Oral  SpO2: 100% 99% 99% 100%  Weight:      Height:        Intake/Output Summary (Last 24 hours) at 02/14/2020 0835 Last data filed at 02/14/2020 0600 Gross per 24 hour  Intake 1198.72 ml  Output 750 ml  Net 448.72 ml   Filed Weights   02/12/20 2030  Weight: 86.2 kg     Data Reviewed:   CBC: Recent Labs  Lab 02/12/20 1415 02/13/20 0326 02/14/20 0359  WBC 9.2 8.5 8.2  NEUTROABS 7.1  --   --   HGB 14.2 12.7 12.7  HCT 42.8 39.1 39.6  MCV 91.6 94.2 94.5  PLT 325 305 304   Basic Metabolic Panel: Recent Labs  Lab 02/12/20 1415 02/13/20  0326 02/14/20 0359  NA 136 138 138  K 4.4 3.9 3.6  CL 100 105 104  CO2 25 25 26   GLUCOSE 127* 160* 141*  BUN 17 17 11   CREATININE 1.15* 0.96 1.00  CALCIUM 9.4 8.7* 8.6*  MG  --   --  2.4   GFR: Estimated Creatinine Clearance: 81.5 mL/min (by C-G formula based on SCr of 1 mg/dL). Liver Function Tests: Recent Labs  Lab 02/12/20 1415 02/14/20 0359  AST 20 16  ALT 18 13  ALKPHOS 56 45  BILITOT 0.3 0.5  PROT 8.7* 7.1  ALBUMIN 4.6 3.9   Recent Labs  Lab 02/12/20 1415    LIPASE 23   No results for input(s): AMMONIA in the last 168 hours. Coagulation Profile: No results for input(s): INR, PROTIME in the last 168 hours. Cardiac Enzymes: No results for input(s): CKTOTAL, CKMB, CKMBINDEX, TROPONINI in the last 168 hours. BNP (last 3 results) No results for input(s): PROBNP in the last 8760 hours. HbA1C: No results for input(s): HGBA1C in the last 72 hours. CBG: Recent Labs  Lab 02/13/20 0029 02/13/20 0733 02/13/20 1522 02/14/20 0037 02/14/20 0737  GLUCAP 166* 124* 111* 128* 126*   Lipid Profile: No results for input(s): CHOL, HDL, LDLCALC, TRIG, CHOLHDL, LDLDIRECT in the last 72 hours. Thyroid Function Tests: No results for input(s): TSH, T4TOTAL, FREET4, T3FREE, THYROIDAB in the last 72 hours. Anemia Panel: No results for input(s): VITAMINB12, FOLATE, FERRITIN, TIBC, IRON, RETICCTPCT in the last 72 hours. Sepsis Labs: No results for input(s): PROCALCITON, LATICACIDVEN in the last 168 hours.  Recent Results (from the past 240 hour(s))  SARS Coronavirus 2 by RT PCR (hospital order, performed in [facility] hospital lab) Nasopharyngeal Nasopharyngeal Swab     Status: None   Collection Time: 02/12/20  2:15 PM   Specimen: Nasopharyngeal Swab  Result Value Ref Range Status   SARS Coronavirus 2 NEGATIVE NEGATIVE Final    Comment: (NOTE) SARS-CoV-2 target nucleic acids are NOT DETECTED. The SARS-CoV-2 RNA is generally detectable in upper and lower respiratory specimens during the acute phase of infection. The lowest concentration of SARS-CoV-2 viral copies this assay can detect is 250 copies / mL. A negative result does not preclude SARS-CoV-2 infection and should not be used as the sole basis for treatment or other patient management decisions.  A negative result may occur with improper specimen collection / handling, submission of specimen other than nasopharyngeal swab, presence of viral mutation(s) within the areas targeted by this assay, and  inadequate number of viral copies (<250 copies / mL). A negative result must be combined with clinical observations, patient history, and epidemiological information. Fact Sheet for Patients:   StrictlyIdeas.no Fact Sheet for Healthcare Providers: BankingDealers.co.za This test is not yet approved or cleared  by the [place] FDA and has been authorized for detection and/or diagnosis of SARS-CoV-2 by FDA under an Emergency Use Authorization (EUA).  This EUA will remain in effect (meaning this test can be used) for the duration of the COVID-19 declaration under Section 564(b)(1) of the Act, 21 U.S.C. section 360bbb-3(b)(1), unless the authorization is terminated or revoked sooner. Performed at [facility], [address], [place], [place] [address]          Radiology Studies: DG Abdomen 1 View  Result Date: 02/12/2020 CLINICAL DATA:  NG tube placement EXAM: ABDOMEN - 1 VIEW COMPARISON:  09/25/2016, CT 02/12/2020 FINDINGS: Esophageal tube side-port over the distal esophagus. Contrast within the renal collecting systems.  IMPRESSION: Esophageal tube tip and side port overlie distal esophagus/GE junction, recommend further advancement by at least 5-10 cm for more optimal positioning. Electronically Signed   By: Jasmine Pang M.D.   On: 02/12/2020 18:45   CT ABDOMEN PELVIS W CONTRAST  Result Date: 02/12/2020 CLINICAL DATA:  Nausea, vomiting EXAM: CT ABDOMEN AND PELVIS WITH CONTRAST TECHNIQUE: Multidetector CT imaging of the abdomen and pelvis was performed using the standard protocol following bolus administration of intravenous contrast. CONTRAST:  OMNIPAQUE IOHEXOL 300 MG/ML  SOLN COMPARISON:  2017 FINDINGS: Lower chest: No acute abnormality. Hepatobiliary: Too small to characterize hypoattenuating lesions of the right hepatic lobe. Gallbladder is unremarkable. Pancreas: Unremarkable. Spleen: Unremarkable. Adrenals/Urinary  Tract: Adrenals, kidneys, and bladder are unremarkable. Stomach/Bowel: Small hiatal hernia. Stomach is otherwise within normal limits. There are several fluid-filled dilated loops of small bowel. Transition point appears to be in the pelvis ventral to the bladder (series 2, image 69) with more distal ileum demonstrating decompressed appearance. Proximal extent of dilatation is within the left quadrant with nondilated proximal small bowel seen beginning on series 5, image 34. Normal appendix. Vascular/Lymphatic: No significant vascular findings are present. No enlarged abdominal or pelvic lymph nodes. Reproductive: Status post hysterectomy. No adnexal masses. Other: Postoperative changes in the ventral abdominal wall. No ascites. Musculoskeletal: No acute osseous abnormality. IMPRESSION: Distal small-bowel obstruction with suspected transition point in the pelvis anterior to the bladder. Likely same transition point seen on 2017 study. Electronically Signed   By: Guadlupe Spanish M.D.   On: 02/12/2020 16:11   DG Abd Portable 1V-Small Bowel Obstruction Protocol-initial, 8 hr delay  Result Date: 02/13/2020 CLINICAL DATA:  Small bowel protocol to evaluate for small bowel obstruction. EXAM: PORTABLE ABDOMEN - 1 VIEW COMPARISON:  February 13, 2020 (12:05 p.m.) FINDINGS: A nasogastric tube is seen with its distal tip overlying the body of the stomach. Mildly dilated small bowel loops are again seen along the medial aspect of the mid right abdomen. Radiopaque contrast is seen within the mid and distal portions of the ascending colon and throughout the transverse colon. Small radiopaque surgical clips are seen overlying the pelvis, bilaterally. No radio-opaque calculi or other significant radiographic abnormality are seen. IMPRESSION: 1. Nasogastric tube positioning, as described above. 2. Persistent mildly dilated small bowel loops along the medial aspect of the mid right abdomen. Electronically Signed   By: Aram Candela  M.D.   On: 02/13/2020 23:54   DG Abd Portable 1V-Small Bowel Protocol-Position Verification  Result Date: 02/13/2020 CLINICAL DATA:  Nausea and vomiting, history of bowel obstruction EXAM: PORTABLE ABDOMEN - 1 VIEW COMPARISON:  02/12/2020 FINDINGS: Frontal view of the lower chest and upper abdomen demonstrates enteric catheter tip and side port projecting over gastric body. Mild distension of the small bowel within the central abdomen. No free gas in the greater peritoneal sac. Lung bases are clear. IMPRESSION: 1. Enteric catheter overlying gastric body. Electronically Signed   By: Sharlet Salina M.D.   On: 02/13/2020 15:06        Scheduled Meds: Continuous Infusions:  potassium chloride       LOS: 2 days   Time spent= 35 mins    Shion Bluestein Joline Maxcy, MD [facility]  If 7PM-7AM, please contact night-coverage  02/14/2020, 8:35 AM

## 2020-02-14 NOTE — Progress Notes (Signed)
Pt stable at time of bedside rounding. No needs at time of rounding. NG tube to low intermittent wall suction.

## 2020-02-15 LAB — COMPREHENSIVE METABOLIC PANEL
ALT: 12 U/L (ref 0–44)
AST: 14 U/L — ABNORMAL LOW (ref 15–41)
Albumin: 4.2 g/dL (ref 3.5–5.0)
Alkaline Phosphatase: 52 U/L (ref 38–126)
Anion gap: 7 (ref 5–15)
BUN: 11 mg/dL (ref 6–20)
CO2: 26 mmol/L (ref 22–32)
Calcium: 8.9 mg/dL (ref 8.9–10.3)
Chloride: 102 mmol/L (ref 98–111)
Creatinine, Ser: 1.05 mg/dL — ABNORMAL HIGH (ref 0.44–1.00)
GFR calc Af Amer: >60 mL/min (ref >60)
GFR calc non Af Amer: >60 mL/min (ref >60)
Glucose, Bld: 132 mg/dL — ABNORMAL HIGH (ref 70–99)
Potassium: 3.7 mmol/L (ref 3.5–5.1)
Sodium: 135 mmol/L (ref 135–145)
Total Bilirubin: 0.8 mg/dL (ref 0.3–1.2)
Total Protein: 7.5 g/dL (ref 6.5–8.1)

## 2020-02-15 LAB — CBC
HCT: 37.8 % (ref 36.0–46.0)
Hemoglobin: 12.5 g/dL (ref 12.0–15.0)
MCH: 30.5 pg (ref 26.0–34.0)
MCHC: 33.1 g/dL (ref 30.0–36.0)
MCV: 92.2 fL (ref 80.0–100.0)
Platelets: 300 10*3/uL (ref 150–400)
RBC: 4.1 MIL/uL (ref 3.87–5.11)
RDW: 12.4 % (ref 11.5–15.5)
WBC: 10 10*3/uL (ref 4.0–10.5)
nRBC: 0 % (ref 0.0–0.2)

## 2020-02-15 LAB — MAGNESIUM: Magnesium: 2.1 mg/dL (ref 1.7–2.4)

## 2020-02-15 LAB — GLUCOSE, CAPILLARY: Glucose-Capillary: 120 mg/dL — ABNORMAL HIGH (ref 70–99)

## 2020-02-15 MED ORDER — POTASSIUM CHLORIDE 10 MEQ/100ML IV SOLN
10.0000 meq | INTRAVENOUS | Status: AC
  Administered 2020-02-15 (×4): 10 meq via INTRAVENOUS
  Filled 2020-02-15 (×4): qty 100

## 2020-02-15 NOTE — Progress Notes (Signed)
PROGRESS NOTE    Renee Murphy  LOV:[id number] DOB: December 11, 1970 DOA: 02/12/2020 PCP: Robyne Peers, MD   Brief Narrative:  49 year old with history of hysterectomy, hernia repair, urethral surgery, small bowel obstructions presented to [facility] with complaints of abdominal pain.  CT showed small bowel obstruction with transition point.  NG tube placed, general surgery consulted-recommended conservative management   Assessment & Plan:   Principal Problem:   SBO (small bowel obstruction) (HCC)  Small bowel obstruction, distal with transition point in pelvis anterior to bladder History of multiple abdominal surgeries -Seen by general surgery, conservative management at this time.  Continue IV fluids, Accu-Cheks and supportive care.  Continue to ambulate -Small bowel protocol-persistently mildly dilated small bowel loops -NG tube clamped this morning, plans to check residuals later and advance diet as necessary  History of COVID-19 infection -Supportive care   DVT prophylaxis: None indicated patient is still having Code Status: Full Family Communication: Husband at bedside  Status is: Inpatient  Remains inpatient appropriate because:IV treatments appropriate due to intensity of illness or inability to take PO   Dispo: The patient is from: Home              Anticipated d/c is to: Home              Anticipated d/c date is: 2 days              Patient currently is not medically stable to d/c.  Patient is small bowel obstruction, currently NPO.  NG Tube in place.   Subjective: Overnight patient was able to pass gas and denies any nausea or vomiting.  Still having quite a bit of output from NG tube.  It has been clamped this morning.  Review of Systems Otherwise negative except as per HPI, including: General: Denies fever, chills, night sweats or unintended weight loss. Resp: Denies cough, wheezing, shortness of breath. Cardiac: Denies chest pain,  palpitations, orthopnea, paroxysmal nocturnal dyspnea. GI: Denies abdominal pain, nausea, vomiting, diarrhea or constipation GU: Denies dysuria, frequency, hesitancy or incontinence MS: Denies muscle aches, joint pain or swelling Neuro: Denies headache, neurologic deficits (focal weakness, numbness, tingling), abnormal gait Psych: Denies anxiety, depression, SI/HI/AVH Skin: Denies new rashes or lesions ID: Denies sick contacts, exotic exposures, travel   Examination:  Constitutional: Not in acute distress, clamped NG tube Respiratory: Clear to auscultation bilaterally Cardiovascular: Normal sinus rhythm, no rubs Abdomen: Nontender nondistended good bowel sounds Musculoskeletal: No edema noted Skin: No rashes seen Neurologic: CN 2-12 grossly intact.  And nonfocal Psychiatric: Normal judgment and insight. Alert and oriented x 3. Normal mood.  Objective: Vitals:   02/14/20 0547 02/14/20 1338 02/14/20 2141 02/15/20 0540  BP: (!) 154/93 (!) 153/81 (!) 169/87 (!) 163/83  Pulse: 74 73 76 79  Resp:  14 16 15   Temp: 98.4 F (36.9 C) 98 F (36.7 C) 98.5 F (36.9 C) 98.6 F (37 C)  TempSrc: Oral Oral Oral   SpO2: 100% 100% 100% 100%  Weight:      Height:        Intake/Output Summary (Last 24 hours) at 02/15/2020 1107 Last data filed at 02/15/2020 0600 Gross per 24 hour  Intake 1790.81 ml  Output 2450 ml  Net -659.19 ml   Filed Weights   02/12/20 2030  Weight: 86.2 kg     Data Reviewed:   CBC: Recent Labs  Lab 02/12/20 1415 02/13/20 0326 02/14/20 0359 02/15/20 0418  WBC 9.2 8.5 8.2 10.0  NEUTROABS  7.1  --   --   --   HGB 14.2 12.7 12.7 12.5  HCT 42.8 39.1 39.6 37.8  MCV 91.6 94.2 94.5 92.2  PLT 325 305 304 300   Basic Metabolic Panel: Recent Labs  Lab 02/12/20 1415 02/13/20 0326 02/14/20 0359 02/15/20 0418  NA 136 138 138 135  K 4.4 3.9 3.6 3.7  CL 100 105 104 102  CO2 25 25 26 26   GLUCOSE 127* 160* 141* 132*  BUN 17 17 11 11   CREATININE 1.15* 0.96 1.00  1.05*  CALCIUM 9.4 8.7* 8.6* 8.9  MG  --   --  2.4 2.1   GFR: Estimated Creatinine Clearance: 77.6 mL/min (A) (by C-G formula based on SCr of 1.05 mg/dL (H)). Liver Function Tests: Recent Labs  Lab 02/12/20 1415 02/14/20 0359 02/15/20 0418  AST 20 16 14*  ALT 18 13 12   ALKPHOS 56 45 52  BILITOT 0.3 0.5 0.8  PROT 8.7* 7.1 7.5  ALBUMIN 4.6 3.9 4.2   Recent Labs  Lab 02/12/20 1415  LIPASE 23   No results for input(s): AMMONIA in the last 168 hours. Coagulation Profile: No results for input(s): INR, PROTIME in the last 168 hours. Cardiac Enzymes: No results for input(s): CKTOTAL, CKMB, CKMBINDEX, TROPONINI in the last 168 hours. BNP (last 3 results) No results for input(s): PROBNP in the last 8760 hours. HbA1C: No results for input(s): HGBA1C in the last 72 hours. CBG: Recent Labs  Lab 02/13/20 1522 02/14/20 0037 02/14/20 0737 02/14/20 1545 02/14/20 2359  GLUCAP 111* 128* 126* 105* 120*   Lipid Profile: No results for input(s): CHOL, HDL, LDLCALC, TRIG, CHOLHDL, LDLDIRECT in the last 72 hours. Thyroid Function Tests: No results for input(s): TSH, T4TOTAL, FREET4, T3FREE, THYROIDAB in the last 72 hours. Anemia Panel: No results for input(s): VITAMINB12, FOLATE, FERRITIN, TIBC, IRON, RETICCTPCT in the last 72 hours. Sepsis Labs: No results for input(s): PROCALCITON, LATICACIDVEN in the last 168 hours.  Recent Results (from the past 240 hour(s))  SARS Coronavirus 2 by RT PCR (hospital order, performed in [facility] hospital lab) Nasopharyngeal Nasopharyngeal Swab     Status: None   Collection Time: 02/12/20  2:15 PM   Specimen: Nasopharyngeal Swab  Result Value Ref Range Status   SARS Coronavirus 2 NEGATIVE NEGATIVE Final    Comment: (NOTE) SARS-CoV-2 target nucleic acids are NOT DETECTED. The SARS-CoV-2 RNA is generally detectable in upper and lower respiratory specimens during the acute phase of infection. The lowest concentration of SARS-CoV-2 viral copies  this assay can detect is 250 copies / mL. A negative result does not preclude SARS-CoV-2 infection and should not be used as the sole basis for treatment or other patient management decisions.  A negative result may occur with improper specimen collection / handling, submission of specimen other than nasopharyngeal swab, presence of viral mutation(s) within the areas targeted by this assay, and inadequate number of viral copies (<250 copies / mL). A negative result must be combined with clinical observations, patient history, and epidemiological information. Fact Sheet for Patients:   04/15/20 Fact Sheet for Healthcare Providers: [facility] This test is not yet approved or cleared  by the 04/13/20 FDA and has been authorized for detection and/or diagnosis of SARS-CoV-2 by FDA under an Emergency Use Authorization (EUA).  This EUA will remain in effect (meaning this test can be used) for the duration of the COVID-19 declaration under Section 564(b)(1) of the Act, 21 U.S.C. section 360bbb-3(b)(1), unless the authorization is terminated  or revoked sooner. Performed at [facility], [address] [place], [place] [address]          Radiology Studies: DG Abd Portable 1V-Small Bowel Obstruction Protocol-initial, 8 hr delay  Result Date: 02/13/2020 CLINICAL DATA:  Small bowel protocol to evaluate for small bowel obstruction. EXAM: PORTABLE ABDOMEN - 1 VIEW COMPARISON:  February 13, 2020 (12:05 p.m.) FINDINGS: A nasogastric tube is seen with its distal tip overlying the body of the stomach. Mildly dilated small bowel loops are again seen along the medial aspect of the mid right abdomen. Radiopaque contrast is seen within the mid and distal portions of the ascending colon and throughout the transverse colon. Small radiopaque surgical clips are seen overlying the pelvis, bilaterally. No radio-opaque calculi or other  significant radiographic abnormality are seen. IMPRESSION: 1. Nasogastric tube positioning, as described above. 2. Persistent mildly dilated small bowel loops along the medial aspect of the mid right abdomen. Electronically Signed   By: Aram Candela M.D.   On: 02/13/2020 23:54   DG Abd Portable 1V-Small Bowel Protocol-Position Verification  Result Date: 02/13/2020 CLINICAL DATA:  Nausea and vomiting, history of bowel obstruction EXAM: PORTABLE ABDOMEN - 1 VIEW COMPARISON:  02/12/2020 FINDINGS: Frontal view of the lower chest and upper abdomen demonstrates enteric catheter tip and side port projecting over gastric body. Mild distension of the small bowel within the central abdomen. No free gas in the greater peritoneal sac. Lung bases are clear. IMPRESSION: 1. Enteric catheter overlying gastric body. Electronically Signed   By: Sharlet Salina M.D.   On: 02/13/2020 15:06        Scheduled Meds: Continuous Infusions: . dextrose 5 % and 0.45% NaCl 75 mL/hr at 02/15/20 0600  . potassium chloride 10 mEq (02/15/20 0940)     LOS: 3 days   Time spent= 35 mins    Laureen Frederic Joline Maxcy, MD [facility]  If 7PM-7AM, please contact night-coverage  02/15/2020, 11:07 AM

## 2020-02-15 NOTE — Progress Notes (Signed)
Subjective/Chief Complaint: Reports no abdominal pain this morning Having some flatus today, no BM. New gagging around tube - denies tube getting dislodged overnight. NG put out 1,050 last 24 hours   Confirms history of pSBO in 2015 [facility] DC), one episode managed OP on liquid diet, and 3 previous admissions for pSBO in 2016, 2017, 2018. Objective: Vital signs in last 24 hours: Temp:  [98 F (36.7 C)-98.6 F (37 C)] 98.6 F (37 C) (06/07 0540) Pulse Rate:  [73-79] 79 (06/07 0540) Resp:  [14-16] 15 (06/07 0540) BP: (153-169)/(81-87) 163/83 (06/07 0540) SpO2:  [100 %] 100 % (06/07 0540) Last BM Date: 02/12/20  Intake/Output from previous day: 06/06 0701 - 06/07 0700 In: 1790.8 [P.O.:300; I.V.:1085.8; NG/GT:5; IV Piggyback:400] Out: 2450 [Urine:1400; Emesis/NG output:1050] Intake/Output this shift: No intake/output data recorded.  Exam: Awake and alert Looks comfortable Abdomen soft, mild distention, no peritoneal signs; NG with some red in tubing - pt has been using chloraseptic spray.   Lab Results:  Recent Labs    02/14/20 0359 02/15/20 0418  WBC 8.2 10.0  HGB 12.7 12.5  HCT 39.6 37.8  PLT 304 300   BMET Recent Labs    02/14/20 0359 02/15/20 0418  NA 138 135  K 3.6 3.7  CL 104 102  CO2 26 26  GLUCOSE 141* 132*  BUN 11 11  CREATININE 1.00 1.05*  CALCIUM 8.6* 8.9   PT/INR No results for input(s): LABPROT, INR in the last 72 hours. ABG No results for input(s): PHART, HCO3 in the last 72 hours.  Invalid input(s): PCO2, PO2  Studies/Results: DG Abd Portable 1V-Small Bowel Obstruction Protocol-initial, 8 hr delay  Result Date: 02/13/2020 CLINICAL DATA:  Small bowel protocol to evaluate for small bowel obstruction. EXAM: PORTABLE ABDOMEN - 1 VIEW COMPARISON:  February 13, 2020 (12:05 p.m.) FINDINGS: A nasogastric tube is seen with its distal tip overlying the body of the stomach. Mildly dilated small bowel loops are again seen along the medial aspect of  the mid right abdomen. Radiopaque contrast is seen within the mid and distal portions of the ascending colon and throughout the transverse colon. Small radiopaque surgical clips are seen overlying the pelvis, bilaterally. No radio-opaque calculi or other significant radiographic abnormality are seen. IMPRESSION: 1. Nasogastric tube positioning, as described above. 2. Persistent mildly dilated small bowel loops along the medial aspect of the mid right abdomen. Electronically Signed   By: Aram Candela M.D.   On: 02/13/2020 23:54   DG Abd Portable 1V-Small Bowel Protocol-Position Verification  Result Date: 02/13/2020 CLINICAL DATA:  Nausea and vomiting, history of bowel obstruction EXAM: PORTABLE ABDOMEN - 1 VIEW COMPARISON:  02/12/2020 FINDINGS: Frontal view of the lower chest and upper abdomen demonstrates enteric catheter tip and side port projecting over gastric body. Mild distension of the small bowel within the central abdomen. No free gas in the greater peritoneal sac. Lung bases are clear. IMPRESSION: 1. Enteric catheter overlying gastric body. Electronically Signed   By: Sharlet Salina M.D.   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 Protocol films with contrast in the colon.  Still with some mildly dilated loops of small bowel. NG - 1,050 cc/24h Some clinical improvement (+flatus) but still high NG tube output. Will do clamp trial. RN to check  residuals at 1430. Will allow ice chips Ambulate    LOS: 3 days    Jill Alexanders 02/15/2020

## 2020-02-15 NOTE — Telephone Encounter (Signed)
Rescheduled pt for the 22nd

## 2020-02-16 ENCOUNTER — Ambulatory Visit: Payer: 59 | Admitting: Sports Medicine

## 2020-02-16 LAB — COMPREHENSIVE METABOLIC PANEL
ALT: 12 U/L (ref 0–44)
AST: 13 U/L — ABNORMAL LOW (ref 15–41)
Albumin: 3.6 g/dL (ref 3.5–5.0)
Alkaline Phosphatase: 45 U/L (ref 38–126)
Anion gap: 9 (ref 5–15)
BUN: 11 mg/dL (ref 6–20)
CO2: 23 mmol/L (ref 22–32)
Calcium: 8.4 mg/dL — ABNORMAL LOW (ref 8.9–10.3)
Chloride: 103 mmol/L (ref 98–111)
Creatinine, Ser: 1.18 mg/dL — ABNORMAL HIGH (ref 0.44–1.00)
GFR calc Af Amer: >60 mL/min (ref >60)
GFR calc non Af Amer: 55 mL/min — ABNORMAL LOW (ref >60)
Glucose, Bld: 122 mg/dL — ABNORMAL HIGH (ref 70–99)
Potassium: 3.5 mmol/L (ref 3.5–5.1)
Sodium: 135 mmol/L (ref 135–145)
Total Bilirubin: 0.7 mg/dL (ref 0.3–1.2)
Total Protein: 6.6 g/dL (ref 6.5–8.1)

## 2020-02-16 LAB — GLUCOSE, CAPILLARY
Glucose-Capillary: 100 mg/dL — ABNORMAL HIGH (ref 70–99)
Glucose-Capillary: 141 mg/dL — ABNORMAL HIGH (ref 70–99)
Glucose-Capillary: 94 mg/dL (ref 70–99)
Glucose-Capillary: 95 mg/dL (ref 70–99)

## 2020-02-16 LAB — CBC
HCT: 34.9 % — ABNORMAL LOW (ref 36.0–46.0)
Hemoglobin: 11.6 g/dL — ABNORMAL LOW (ref 12.0–15.0)
MCH: 30.4 pg (ref 26.0–34.0)
MCHC: 33.2 g/dL (ref 30.0–36.0)
MCV: 91.6 fL (ref 80.0–100.0)
Platelets: 242 10*3/uL (ref 150–400)
RBC: 3.81 MIL/uL — ABNORMAL LOW (ref 3.87–5.11)
RDW: 12.1 % (ref 11.5–15.5)
WBC: 9.8 10*3/uL (ref 4.0–10.5)
nRBC: 0 % (ref 0.0–0.2)

## 2020-02-16 LAB — MAGNESIUM: Magnesium: 2.1 mg/dL (ref 1.7–2.4)

## 2020-02-16 MED ORDER — POTASSIUM CHLORIDE CRYS ER 20 MEQ PO TBCR
40.0000 meq | EXTENDED_RELEASE_TABLET | Freq: Once | ORAL | Status: AC
  Administered 2020-02-16: 40 meq via ORAL
  Filled 2020-02-16: qty 2

## 2020-02-16 NOTE — Progress Notes (Signed)
PROGRESS NOTE    Renee Murphy  UKG:[id number] DOB: 1971/02/29 DOA: 02/12/2020 PCP: Robyne Peers, MD   Brief Narrative:  49 year old with history of hysterectomy, hernia repair, urethral surgery, small bowel obstructions presented to [place] with complaints of abdominal pain.  CT showed small bowel obstruction with transition point.  NG tube placed, general surgery consulted-recommended conservative management.  Improved with conservative management therefore NG tube removed.   Assessment & Plan:   Principal Problem:   SBO (small bowel obstruction) (HCC)  Small bowel obstruction, distal with transition point in pelvis anterior to bladder History of multiple abdominal surgeries -Seen by general surgery, conservative management at this time.  Continue IV fluids, Accu-Cheks and supportive care.  Continue to ambulate -Small bowel protocol-persistently mildly dilated small bowel loops -NG tube removed 6/7.  Plans to advance to full liquid diet today, if tolerates she can advance to soft.  Given her multiple abdominal surgeries, slow progression.  History of COVID-19 infection -Supportive care   DVT prophylaxis: None indicated patient is still having Code Status: Full Family Communication: Husband at bedside  Status is: Inpatient  Remains inpatient appropriate because:IV treatments appropriate due to intensity of illness or inability to take PO   Dispo: The patient is from: Home              Anticipated d/c is to: Home              Anticipated d/c date is: 1 day              Patient currently is not medically stable to d/c.  Plans to advance diet to full liquid, if she tolerates will advance to soft diet.  Maintain hospital stay at least for next 24 hours  Subjective: Feels a little better this morning, had small bowel movement.  Passing gas.  Tolerating clear diet without any issues.  Review of Systems Otherwise negative except as per HPI,  including: General: Denies fever, chills, night sweats or unintended weight loss. Resp: Denies cough, wheezing, shortness of breath. Cardiac: Denies chest pain, palpitations, orthopnea, paroxysmal nocturnal dyspnea. GI: Denies abdominal pain, nausea, vomiting, diarrhea or constipation GU: Denies dysuria, frequency, hesitancy or incontinence MS: Denies muscle aches, joint pain or swelling Neuro: Denies headache, neurologic deficits (focal weakness, numbness, tingling), abnormal gait Psych: Denies anxiety, depression, SI/HI/AVH Skin: Denies new rashes or lesions ID: Denies sick contacts, exotic exposures, travel  Examination: Constitutional: Not in acute distress Respiratory: Clear to auscultation bilaterally Cardiovascular: Normal sinus rhythm, no rubs Abdomen: Nontender nondistended good bowel sounds Musculoskeletal: No edema noted Skin: No rashes seen Neurologic: CN 2-12 grossly intact.  And nonfocal Psychiatric: Normal judgment and insight. Alert and oriented x 3. Normal mood.  Objective: Vitals:   02/15/20 1316 02/15/20 2318 02/16/20 0625 02/16/20 0902  BP: (!) 159/88 (!) 150/81 128/74 (!) 147/100  Pulse: 79 74 80 76  Resp: 16 18 16 16   Temp: 99.2 F (37.3 C) 98.5 F (36.9 C) 98.4 F (36.9 C) 98 F (36.7 C)  TempSrc:  Oral Oral Oral  SpO2: 100% 100% 100% 100%  Weight:      Height:        Intake/Output Summary (Last 24 hours) at 02/16/2020 1030 Last data filed at 02/16/2020 0600 Gross per 24 hour  Intake 1918.1 ml  Output --  Net 1918.1 ml   Filed Weights   02/12/20 2030  Weight: 86.2 kg     Data Reviewed:   CBC: Recent Labs  Lab  02/12/20 1415 02/13/20 0326 02/14/20 0359 02/15/20 0418 02/16/20 0328  WBC 9.2 8.5 8.2 10.0 9.8  NEUTROABS 7.1  --   --   --   --   HGB 14.2 12.7 12.7 12.5 11.6*  HCT 42.8 39.1 39.6 37.8 34.9*  MCV 91.6 94.2 94.5 92.2 91.6  PLT 325 305 304 300 242   Basic Metabolic Panel: Recent Labs  Lab 02/12/20 1415 02/13/20 0326  02/14/20 0359 02/15/20 0418 02/16/20 0328  NA 136 138 138 135 135  K 4.4 3.9 3.6 3.7 3.5  CL 100 105 104 102 103  CO2 25 25 26 26 23   GLUCOSE 127* 160* 141* 132* 122*  BUN 17 17 11 11 11   CREATININE 1.15* 0.96 1.00 1.05* 1.18*  CALCIUM 9.4 8.7* 8.6* 8.9 8.4*  MG  --   --  2.4 2.1 2.1   GFR: Estimated Creatinine Clearance: 69 mL/min (A) (by C-G formula based on SCr of 1.18 mg/dL (H)). Liver Function Tests: Recent Labs  Lab 02/12/20 1415 02/14/20 0359 02/15/20 0418 02/16/20 0328  AST 20 16 14* 13*  ALT 18 13 12 12   ALKPHOS 56 45 52 45  BILITOT 0.3 0.5 0.8 0.7  PROT 8.7* 7.1 7.5 6.6  ALBUMIN 4.6 3.9 4.2 3.6   Recent Labs  Lab 02/12/20 1415  LIPASE 23   No results for input(s): AMMONIA in the last 168 hours. Coagulation Profile: No results for input(s): INR, PROTIME in the last 168 hours. Cardiac Enzymes: No results for input(s): CKTOTAL, CKMB, CKMBINDEX, TROPONINI in the last 168 hours. BNP (last 3 results) No results for input(s): PROBNP in the last 8760 hours. HbA1C: No results for input(s): HGBA1C in the last 72 hours. CBG: Recent Labs  Lab 02/14/20 0737 02/14/20 1545 02/14/20 2359 02/16/20 0016 02/16/20 0959  GLUCAP 126* 105* 120* 141* 94   Lipid Profile: No results for input(s): CHOL, HDL, LDLCALC, TRIG, CHOLHDL, LDLDIRECT in the last 72 hours. Thyroid Function Tests: No results for input(s): TSH, T4TOTAL, FREET4, T3FREE, THYROIDAB in the last 72 hours. Anemia Panel: No results for input(s): VITAMINB12, FOLATE, FERRITIN, TIBC, IRON, RETICCTPCT in the last 72 hours. Sepsis Labs: No results for input(s): PROCALCITON, LATICACIDVEN in the last 168 hours.  Recent Results (from the past 240 hour(s))  SARS Coronavirus 2 by RT PCR (hospital order, performed in [facility] hospital lab) Nasopharyngeal Nasopharyngeal Swab     Status: None   Collection Time: 02/12/20  2:15 PM   Specimen: Nasopharyngeal Swab  Result Value Ref Range Status   SARS Coronavirus 2  NEGATIVE NEGATIVE Final    Comment: (NOTE) SARS-CoV-2 target nucleic acids are NOT DETECTED. The SARS-CoV-2 RNA is generally detectable in upper and lower respiratory specimens during the acute phase of infection. The lowest concentration of SARS-CoV-2 viral copies this assay can detect is 250 copies / mL. A negative result does not preclude SARS-CoV-2 infection and should not be used as the sole basis for treatment or other patient management decisions.  A negative result may occur with improper specimen collection / handling, submission of specimen other than nasopharyngeal swab, presence of viral mutation(s) within the areas targeted by this assay, and inadequate number of viral copies (<250 copies / mL). A negative result must be combined with clinical observations, patient history, and epidemiological information. Fact Sheet for Patients:   04/17/20 Fact Sheet for Healthcare Providers: [facility] This test is not yet approved or cleared  by the 04/13/20 FDA and has been authorized for detection and/or diagnosis  of SARS-CoV-2 by FDA under an Emergency Use Authorization (EUA).  This EUA will remain in effect (meaning this test can be used) for the duration of the COVID-19 declaration under Section 564(b)(1) of the Act, 21 U.S.C. section 360bbb-3(b)(1), unless the authorization is terminated or revoked sooner. Performed at [facility], [address] [place], [place] [address]          Radiology Studies: No results found.      Scheduled Meds: Continuous Infusions: . dextrose 5 % and 0.45% NaCl 75 mL/hr at 02/16/20 0630     LOS: 4 days   Time spent= 35 mins    Jimmye Wisnieski Joline Maxcy, MD [facility]  If 7PM-7AM, please contact night-coverage  02/16/2020, 10:30 AM

## 2020-02-16 NOTE — Discharge Instructions (Signed)
Low-Fiber Eating Plan FOLLOW THIS EATING PLAN FOR 1-2 WEEKS AFTER YOUR HOSPITALIZATION.  Fiber is found in fruits, vegetables, whole grains, and beans. Eating a diet low in fiber helps to reduce how often you have bowel movements and how much you produce during a bowel movement. A low-fiber eating plan may help your digestive system heal if:  You have certain conditions, such as Crohn's disease or bowel obstructions  You recently had radiation therapy on your pelvis or bowel.  You recently had intestinal surgery.  You have a new surgical opening in your abdomen (colostomy or ileostomy).  Your intestine is narrowed (stricture). Your health care provider will determine how long you need to stay on this diet. Your health care provider may recommend that you work with a diet and nutrition specialist (dietitian). What are tips for following this plan? General guidelines  Follow recommendations from your dietitian about how much fiber you should have each day.  Most people on this eating plan should try to eat less than 10 grams (g) of fiber each day. Your daily fiber goal is _________________ g.  Take vitamin and mineral supplements as told by your health care provider or dietitian. Chewable or liquid forms are best when on this eating plan. Reading food labels  Check food labels for the amount of dietary fiber.  Choose foods that have less than 2 grams of fiber in one serving. Cooking  Use white flour and other allowed grains for baking and cooking.  Cook meat using methods that keep it tender, such as braising or poaching.  Cook eggs until the yolk is completely solid.  Cook with healthy oils, such as olive oil or canola oil. Meal planning   Eat 5-6 small meals throughout the day instead of 3 large meals.  If you are lactose intolerant: ? Choose low-lactose dairy foods. ? Do not eat dairy foods, if told by your dietitian.  Limit fat and oils to less than 8 teaspoons a  day.  Eat small portions of desserts. What foods are allowed? The items listed below may not be a complete list. Talk with your dietitian about what dietary choices are best for you. Grains All bread and crackers made with white flour. Waffles, pancakes, and [place] toast. Bagels. Pretzels. Melba toast, zwieback, and matzoh. Cooked and dried cereals that do not contain whole grains, added fiber, seeds, or dried fruit. CornmealDomenick Gong. Hot and cold cereals made with refined corn, wheat, rice, or oats. Plain pasta and noodles. White rice. Vegetables Well-cooked or canned vegetables without skin, seeds, or stems. Cooked potatoes without skins. Vegetable juice. Fruits Soft-cooked or canned fruits without skin and seeds. Peeled ripe banana. Applesauce. Fruit juice without pulp. Meats and other protein foods Ground meat. Tender cuts of meat or poultry. Eggs. Fish, seafood, and shellfish. Smooth nut butters. Tofu. Dairy All milk products and drinks. Lactose-free milks, including rice, soy, and almond milks. Yogurt without fruit, nuts, chocolate, or granola mix-ins. Sour cream. Cottage cheese. Cheese. Beverages Decaf coffee. Fruit and vegetable juices or smoothies (in small amounts, with no pulp or skins, and with fruits from allowed list). Sports drinks. Herbal tea. Fats and oils Olive oil, canola oil, sunflower oil, flaxseed oil, and grapeseed oil. Mayonnaise. Cream cheese. Margarine. Butter. Sweets and desserts Plain cakes and cookies. Cream pies and pies made with allowed fruits. Pudding. Custard. Fruit gelatin. Sherbet. Popsicles. Ice cream without nuts. Plain hard candy. Honey. Jelly. Molasses. Syrups, including chocolate syrup. Chocolate. Marshmallows. Gumdrops. Seasoning and other foods  Bouillon. Broth. Cream soups made from allowed foods. Strained soup. Casseroles made with allowed foods. Ketchup. Mild mustard. Mild salad dressings. Plain gravies. Vinegar. Spices in moderation. Salt.  Sugar. What foods are not allowed? The items listed below may not be a complete list. Talk with your dietitian about what dietary choices are best for you. Grains Whole wheat and whole grain breads and crackers. Multigrain breads and crackers. Rye bread. Whole grain or multigrain cereals. Cereals with nuts, raisins, or coconut. Bran. Coarse wheat cereals. Granola. High-fiber cereals. Cornmeal or corn bread. Whole grain pasta. Wild or brown rice. Quinoa. Popcorn. Buckwheat. Wheat germ. Vegetables Potato skins. Raw or undercooked vegetables. All beans and bean sprouts. Cooked greens. Corn. Peas. Cabbage. Beets. Broccoli. Brussels sprouts. Cauliflower. Mushrooms. Onions. Peppers. Parsnips. Okra. Sauerkraut. Fruit Raw or dried fruit. Berries. Fruit juice with pulp. Prune juice. Meats and other protein foods Tough, fibrous meats with gristle. Fatty meat. Poultry with skin. Fried meat, Environmental education officer, or fish. Deli or lunch meats. Sausage, bacon, and hot dogs. Nuts and chunky nut butter. Dried peas, beans, and lentils. Dairy Yogurt with fruit, nuts, chocolate, or granola mix-ins. Beverages Caffeinated coffee and teas. Fats and oils Avocado. Coconut. Sweets and desserts Desserts, cookies, or candies that contain nuts or coconut. Dried fruit. Jams and preserves with seeds. Marmalade. Any dessert made with fruits or grains that are not allowed. Seasoning and other foods Corn tortilla chips. Soups made with vegetables or grains that are not allowed. Relish. Horseradish. Rosita Fire. Olives. Summary  Most people on a low-fiber eating plan should eat less than 10 grams of fiber a day. Follow recommendations from your dietitian about how much fiber you should have each day.  Always check food labels to see the dietary fiber content of packaged foods. In general, a low-fiber food will have fewer than 2 grams of fiber per serving.  In general, try to avoid whole grains, raw fruits and vegetables, dried fruit, tough  cuts of meat, nuts, and seeds.  Take a vitamin and mineral supplement as told by your health care provider or dietitian. This information is not intended to replace advice given to you by your health care provider. Make sure you discuss any questions you have with your health care provider. Document Revised: 12/19/2018 Document Reviewed: 10/30/2016 Elsevier Patient Education  2020 [employer].

## 2020-02-16 NOTE — Progress Notes (Signed)
   Subjective/Chief Complaint: Denies pain. Having some flatus- reports multiple episodes of flatus yesterday AM but less since then. One small BM this AM. Tolerating clears without N/V.   Objective: Vital signs in last 24 hours: Temp:  [98 F (36.7 C)-99.2 F (37.3 C)] 98 F (36.7 C) (06/08 0902) Pulse Rate:  [74-80] 76 (06/08 0902) Resp:  [16-18] 16 (06/08 0902) BP: (128-159)/(74-100) 147/100 (06/08 0902) SpO2:  [100 %] 100 % (06/08 0902) Last BM Date: 02/12/20  Intake/Output from previous day: 06/07 0701 - 06/08 0700 In: 2217.5 [P.O.:270; I.V.:1798.3; IV Piggyback:149.2] Out: -  Intake/Output this shift: No intake/output data recorded.  Exam: Awake and alert Looks comfortable Abdomen soft, mild distention, non-tender no peritoneal signs  Lab Results:  Recent Labs    02/15/20 0418 02/16/20 0328  WBC 10.0 9.8  HGB 12.5 11.6*  HCT 37.8 34.9*  PLT 300 242   BMET Recent Labs    02/15/20 0418 02/16/20 0328  NA 135 135  K 3.7 3.5  CL 102 103  CO2 26 23  GLUCOSE 132* 122*  BUN 11 11  CREATININE 1.05* 1.18*  CALCIUM 8.9 8.4*   PT/INR No results for input(s): LABPROT, INR in the last 72 hours. ABG No results for input(s): PHART, HCO3 in the last 72 hours.  Invalid input(s): PCO2, PO2  Studies/Results: No results found.  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 Protocol films with contrast in the colon. Passed NGT clamp trial 6/7 >> NG removed Now having increased flatus (still less than normal) and a small BM Advance to FLD. If tolerates then I agree with cautious advancement to SOFT diet.  Low fiber diet for 1-2 weeks while recovering Ambulate    LOS: 4 days    Adam Phenix 02/16/2020

## 2020-02-17 LAB — BASIC METABOLIC PANEL
Anion gap: 9 (ref 5–15)
BUN: 14 mg/dL (ref 6–20)
CO2: 24 mmol/L (ref 22–32)
Calcium: 9 mg/dL (ref 8.9–10.3)
Chloride: 101 mmol/L (ref 98–111)
Creatinine, Ser: 1.12 mg/dL — ABNORMAL HIGH (ref 0.44–1.00)
GFR calc Af Amer: >60 mL/min (ref >60)
GFR calc non Af Amer: 58 mL/min — ABNORMAL LOW (ref >60)
Glucose, Bld: 105 mg/dL — ABNORMAL HIGH (ref 70–99)
Potassium: 3.9 mmol/L (ref 3.5–5.1)
Sodium: 134 mmol/L — ABNORMAL LOW (ref 135–145)

## 2020-02-17 LAB — CBC
HCT: 37.5 % (ref 36.0–46.0)
Hemoglobin: 12.4 g/dL (ref 12.0–15.0)
MCH: 30.5 pg (ref 26.0–34.0)
MCHC: 33.1 g/dL (ref 30.0–36.0)
MCV: 92.1 fL (ref 80.0–100.0)
Platelets: 282 10*3/uL (ref 150–400)
RBC: 4.07 MIL/uL (ref 3.87–5.11)
RDW: 11.8 % (ref 11.5–15.5)
WBC: 8.7 10*3/uL (ref 4.0–10.5)
nRBC: 0 % (ref 0.0–0.2)

## 2020-02-17 LAB — GLUCOSE, CAPILLARY: Glucose-Capillary: 104 mg/dL — ABNORMAL HIGH (ref 70–99)

## 2020-02-17 LAB — MAGNESIUM: Magnesium: 2.2 mg/dL (ref 1.7–2.4)

## 2020-02-17 MED ORDER — DOCUSATE SODIUM 100 MG PO CAPS
100.0000 mg | ORAL_CAPSULE | Freq: Two times a day (BID) | ORAL | Status: DC
  Administered 2020-02-17: 100 mg via ORAL
  Filled 2020-02-17: qty 1

## 2020-02-17 MED ORDER — POLYETHYLENE GLYCOL 3350 17 G PO PACK
17.0000 g | PACK | Freq: Every day | ORAL | Status: DC
  Administered 2020-02-17: 17 g via ORAL
  Filled 2020-02-17: qty 1

## 2020-02-17 MED ORDER — ONDANSETRON 4 MG PO TBDP
4.0000 mg | ORAL_TABLET | Freq: Three times a day (TID) | ORAL | 0 refills | Status: DC | PRN
Start: 2020-02-17 — End: 2024-01-05

## 2020-02-17 NOTE — Progress Notes (Signed)
[facility] Progress Note     Subjective: Patient reports small loose BM yesterday, +flatus. No abdominal pain. Denies nausea. Feels like she is getting full quickly but ambulating helps some. We discussed smaller more frequent meals and seeing if this helps as well.   Objective: Vital signs in last 24 hours: Temp:  [98 F (36.7 C)-98.5 F (36.9 C)] 98.1 F (36.7 C) (06/09 0541) Pulse Rate:  [73-81] 73 (06/09 0541) Resp:  [14-16] 14 (06/09 0541) BP: (132-150)/(79-100) 132/79 (06/09 0541) SpO2:  [97 %-100 %] 97 % (06/09 0541) Last BM Date: 02/12/20  Intake/Output from previous day: No intake/output data recorded. Intake/Output this shift: No intake/output data recorded.  PE: General: pleasant, WD, WN female who is laying in bed in NAD Heart: regular, rate, and rhythm.  Normal s1,s2. No obvious murmurs, gallops, or rubs noted.  Palpable radial and pedal pulses bilaterally Lungs: CTAB, no wheezes, rhonchi, or rales noted.  Respiratory effort nonlabored Abd: soft, NT, ND, +BS, no masses, hernias, or organomegaly Skin: warm and dry with no masses, lesions, or rashes Psych: A&Ox3 with an appropriate affect.   Lab Results:  Recent Labs    02/16/20 0328 02/17/20 0558  WBC 9.8 8.7  HGB 11.6* 12.4  HCT 34.9* 37.5  PLT 242 282   BMET Recent Labs    02/16/20 0328 02/17/20 0558  NA 135 134*  K 3.5 3.9  CL 103 101  CO2 23 24  GLUCOSE 122* 105*  BUN 11 14  CREATININE 1.18* 1.12*  CALCIUM 8.4* 9.0   PT/INR No results for input(s): LABPROT, INR in the last 72 hours. CMP     Component Value Date/Time   NA 134 (L) 02/17/2020 0558   K 3.9 02/17/2020 0558   CL 101 02/17/2020 0558   CO2 24 02/17/2020 0558   GLUCOSE 105 (H) 02/17/2020 0558   BUN 14 02/17/2020 0558   CREATININE 1.12 (H) 02/17/2020 0558   CALCIUM 9.0 02/17/2020 0558   PROT 6.6 02/16/2020 0328   ALBUMIN 3.6 02/16/2020 0328   AST 13 (L) 02/16/2020 0328   ALT 12 02/16/2020 0328   ALKPHOS 45  02/16/2020 0328   BILITOT 0.7 02/16/2020 0328   GFRNONAA 58 (L) 02/17/2020 0558   GFRAA >60 02/17/2020 0558   Lipase     Component Value Date/Time   LIPASE 23 02/12/2020 1415       Studies/Results: No results found.  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 - Protocol films with contrast in the colon. - Passed NGT clamp trial 6/7 >> NG removed - tolerating FLD and having bowel function - advance to soft diet  - Low fiber diet for 1-2 weeks while recovering - Ambulate - possibly d/c later today if tolerating soft diet this AM  FEN: soft diet VTE: SCDs ID: no current abx  LOS: 5 days    Renee Murphy , [facility] Surgery 02/17/2020, 7:57 AM Please see Amion for pager number during day hours 7:00am-4:30pm

## 2020-02-17 NOTE — Discharge Summary (Signed)
Physician Discharge Summary  Renee Murphy NAT:[id number] DOB: 1971/04/22 DOA: 02/12/2020  PCP: Angelica Chessman, MD  Admit date: 02/12/2020 Discharge date: 02/17/2020  Admitted From: Home Disposition: Home  Recommendations for Outpatient Follow-up:  1. Follow up with PCP in 1-2 weeks 2. Please obtain BMP/CBC in one week your next doctors visit.  3. Advised to alternate between full and soft diet over next few days at least for next week  Home Health: None Equipment/Devices: None Discharge Condition: Stable CODE STATUS: Full Diet recommendation: Alternate between soft and full liquid diet over next week  Brief/Interim Summary: 49 year old with history of hysterectomy, hernia repair, urethral surgery, small bowel obstructions presented to med [facility] with complaints of abdominal pain.  CT showed small bowel obstruction with transition point.  NG tube placed, general surgery consulted-recommended conservative management.  Improved with conservative management therefore NG tube removed.  Symptoms improved with conservative management, tolerating oral diet without any issues, she was passing gas and had bowel movement.  Stable for discharge today.   Assessment & Plan:   Principal Problem:   SBO (small bowel obstruction) (HCC)  Small bowel obstruction, distal with transition point in pelvis anterior to bladder History of multiple abdominal surgeries -Resolved with conservative management.  Seen by general surgery during hospitalization.  Advised to alternate between soft and liquid diet at home over next week.  Due to her multiple abdominal surgeries, slow recovery.  Tolerating p.o. without any issues  History of COVID-19 infection -Supportive care   Discharge Diagnoses:  Principal Problem:   SBO (small bowel obstruction) (HCC)    Consultations:  General surgery  Subjective: Feels great no complaints, having bowel movement.  Denies any nausea vomiting or  abdominal pain  Discharge Exam: Vitals:   02/16/20 2043 02/17/20 0541  BP: (!) 150/88 132/79  Pulse: 77 73  Resp: 16 14  Temp: 98 F (36.7 C) 98.1 F (36.7 C)  SpO2: 100% 97%   Vitals:   02/16/20 0902 02/16/20 1354 02/16/20 2043 02/17/20 0541  BP: (!) 147/100 139/84 (!) 150/88 132/79  Pulse: 76 81 77 73  Resp: 16 14 16 14   Temp: 98 F (36.7 C) 98.5 F (36.9 C) 98 F (36.7 C) 98.1 F (36.7 C)  TempSrc: Oral Oral Oral Oral  SpO2: 100% 99% 100% 97%  Weight:      Height:        General: Pt is alert, awake, not in acute distress Cardiovascular: RRR, S1/S2 +, no rubs, no gallops Respiratory: CTA bilaterally, no wheezing, no rhonchi Abdominal: Soft, NT, ND, bowel sounds + Extremities: no edema, no cyanosis  Discharge Instructions   Allergies as of 02/17/2020      Reactions   Aspirin Nausea And Vomiting      Medication List    STOP taking these medications   meloxicam 15 MG tablet Commonly known as: MOBIC   metroNIDAZOLE 500 MG tablet Commonly known as: FLAGYL     TAKE these medications   acetaminophen 325 MG tablet Commonly known as: TYLENOL Take 650 mg by mouth every 6 (six) hours as needed for moderate pain.   albuterol 108 (90 Base) MCG/ACT inhaler Commonly known as: VENTOLIN HFA Inhale 1 puff into the lungs every 6 (six) hours as needed for wheezing or shortness of breath.   Flovent HFA 110 MCG/ACT inhaler Generic drug: fluticasone Inhale 1 puff into the lungs 2 (two) times daily.   ondansetron 4 MG disintegrating tablet Commonly known as: Zofran ODT Take 1 tablet (4  mg total) by mouth every 8 (eight) hours as needed for nausea or vomiting.      Follow-up Information    Angelica Chessman, MD. Schedule an appointment as soon as possible for a visit in 1 week(s).   Specialty: Family Medicine Contact information: [address] [place] [address] [phone]          Allergies  Allergen Reactions  . Aspirin Nausea And Vomiting     You were cared for by a hospitalist during your hospital stay. If you have any questions about your discharge medications or the care you received while you were in the hospital after you are discharged, you can call the unit and asked to speak with the hospitalist on call if the hospitalist that took care of you is not available. Once you are discharged, your primary care physician will handle any further medical issues. Please note that no refills for any discharge medications will be authorized once you are discharged, as it is imperative that you return to your primary care physician (or establish a relationship with a primary care physician if you do not have one) for your aftercare needs so that they can reassess your need for medications and monitor your lab values.   Procedures/Studies: DG Abdomen 1 View  Result Date: 02/12/2020 CLINICAL DATA:  NG tube placement EXAM: ABDOMEN - 1 VIEW COMPARISON:  09/25/2016, CT 02/12/2020 FINDINGS: Esophageal tube side-port over the distal esophagus. Contrast within the renal collecting systems. IMPRESSION: Esophageal tube tip and side port overlie distal esophagus/GE junction, recommend further advancement by at least 5-10 cm for more optimal positioning. Electronically Signed   By: Jasmine Pang M.D.   On: 02/12/2020 18:45   CT ABDOMEN PELVIS W CONTRAST  Result Date: 02/12/2020 CLINICAL DATA:  Nausea, vomiting EXAM: CT ABDOMEN AND PELVIS WITH CONTRAST TECHNIQUE: Multidetector CT imaging of the abdomen and pelvis was performed using the standard protocol following bolus administration of intravenous contrast. CONTRAST:  OMNIPAQUE IOHEXOL 300 MG/ML  SOLN COMPARISON:  2017 FINDINGS: Lower chest: No acute abnormality. Hepatobiliary: Too small to characterize hypoattenuating lesions of the right hepatic lobe. Gallbladder is unremarkable. Pancreas: Unremarkable. Spleen: Unremarkable. Adrenals/Urinary Tract: Adrenals, kidneys, and bladder are unremarkable.  Stomach/Bowel: Small hiatal hernia. Stomach is otherwise within normal limits. There are several fluid-filled dilated loops of small bowel. Transition point appears to be in the pelvis ventral to the bladder (series 2, image 69) with more distal ileum demonstrating decompressed appearance. Proximal extent of dilatation is within the left quadrant with nondilated proximal small bowel seen beginning on series 5, image 34. Normal appendix. Vascular/Lymphatic: No significant vascular findings are present. No enlarged abdominal or pelvic lymph nodes. Reproductive: Status post hysterectomy. No adnexal masses. Other: Postoperative changes in the ventral abdominal wall. No ascites. Musculoskeletal: No acute osseous abnormality. IMPRESSION: Distal small-bowel obstruction with suspected transition point in the pelvis anterior to the bladder. Likely same transition point seen on 2017 study. Electronically Signed   By: Guadlupe Spanish M.D.   On: 02/12/2020 16:11   US Venous Img Lower Unilateral Right  Result Date: 01/19/2020 CLINICAL DATA:  Right lower extremity swelling. Homans sign is present. EXAM: Right LOWER EXTREMITY VENOUS DOPPLER ULTRASOUND TECHNIQUE: Gray-scale sonography with compression, as well as color and duplex ultrasound, were performed to evaluate the deep venous system(s) from the level of the common femoral vein through the popliteal and proximal calf veins. COMPARISON:  None. FINDINGS: VENOUS Normal compressibility of the common femoral, superficial femoral,  and popliteal veins, as well as the visualized calf veins. Visualized portions of profunda femoral vein and great saphenous vein unremarkable. No filling defects to suggest DVT on grayscale or color Doppler imaging. Doppler waveforms show normal direction of venous flow, normal respiratory plasticity and response to augmentation. Limited views of the contralateral common femoral vein are unremarkable. OTHER None. Limitations: none IMPRESSION: No  findings of right lower extremity DVT. Electronically Signed   By: Van Clines M.D.   On: 01/19/2020 18:21   DG Knee Complete 4 Views Right  Result Date: 01/18/2020 CLINICAL DATA:  Intermittent right lateral knee pain and swelling for the past 2 months. EXAM: RIGHT KNEE - COMPLETE 4+ VIEW COMPARISON:  None. FINDINGS: No evidence of fracture, dislocation, or joint effusion. No evidence of arthropathy or other focal bone abnormality. Soft tissues are unremarkable. IMPRESSION: Negative. Electronically Signed   By: Titus Dubin M.D.   On: 01/18/2020 11:42   DG Abd Portable 1V-Small Bowel Obstruction Protocol-initial, 8 hr delay  Result Date: 02/13/2020 CLINICAL DATA:  Small bowel protocol to evaluate for small bowel obstruction. EXAM: PORTABLE ABDOMEN - 1 VIEW COMPARISON:  February 13, 2020 (12:05 p.m.) FINDINGS: A nasogastric tube is seen with its distal tip overlying the body of the stomach. Mildly dilated small bowel loops are again seen along the medial aspect of the mid right abdomen. Radiopaque contrast is seen within the mid and distal portions of the ascending colon and throughout the transverse colon. Small radiopaque surgical clips are seen overlying the pelvis, bilaterally. No radio-opaque calculi or other significant radiographic abnormality are seen. IMPRESSION: 1. Nasogastric tube positioning, as described above. 2. Persistent mildly dilated small bowel loops along the medial aspect of the mid right abdomen. Electronically Signed   By: Virgina Norfolk M.D.   On: 02/13/2020 23:54   DG Abd Portable 1V-Small Bowel Protocol-Position Verification  Result Date: 02/13/2020 CLINICAL DATA:  Nausea and vomiting, history of bowel obstruction EXAM: PORTABLE ABDOMEN - 1 VIEW COMPARISON:  02/12/2020 FINDINGS: Frontal view of the lower chest and upper abdomen demonstrates enteric catheter tip and side port projecting over gastric body. Mild distension of the small bowel within the central abdomen. No  free gas in the greater peritoneal sac. Lung bases are clear. IMPRESSION: 1. Enteric catheter overlying gastric body. Electronically Signed   By: Randa Ngo M.D.   On: 02/13/2020 15:06      The results of significant diagnostics from this hospitalization (including imaging, microbiology, ancillary and laboratory) are listed below for reference.     Microbiology: Recent Results (from the past 240 hour(s))  SARS Coronavirus 2 by RT PCR (hospital order, performed in [facility] hospital lab) Nasopharyngeal Nasopharyngeal Swab     Status: None   Collection Time: 02/12/20  2:15 PM   Specimen: Nasopharyngeal Swab  Result Value Ref Range Status   SARS Coronavirus 2 NEGATIVE NEGATIVE Final    Comment: (NOTE) SARS-CoV-2 target nucleic acids are NOT DETECTED. The SARS-CoV-2 RNA is generally detectable in upper and lower respiratory specimens during the acute phase of infection. The lowest concentration of SARS-CoV-2 viral copies this assay can detect is 250 copies / mL. A negative result does not preclude SARS-CoV-2 infection and should not be used as the sole basis for treatment or other patient management decisions.  A negative result may occur with improper specimen collection / handling, submission of specimen other than nasopharyngeal swab, presence of viral mutation(s) within the areas targeted by this assay, and inadequate number of viral  copies (<250 copies / mL). A negative result must be combined with clinical observations, patient history, and epidemiological information. Fact Sheet for Patients:   BoilerBrush.com.cyhttps://www.fda.gov/media/136312/download Fact Sheet for Healthcare Providers: https://pope.com/https://www.fda.gov/media/136313/download This test is not yet approved or cleared  by the [place] FDA and has been authorized for detection and/or diagnosis of SARS-CoV-2 by FDA under an Emergency Use Authorization (EUA).  This EUA will remain in effect (meaning this test can be used) for the  duration of the COVID-19 declaration under Section 564(b)(1) of the Act, 21 U.S.C. section 360bbb-3(b)(1), unless the authorization is terminated or revoked sooner. Performed at [facility], [address] [place], [place] [id number]      Labs: BNP (last 3 results) No results for input(s): BNP in the last 8760 hours. Basic Metabolic Panel: Recent Labs  Lab 02/13/20 0326 02/14/20 0359 02/15/20 0418 02/16/20 0328 02/17/20 0558  NA 138 138 135 135 134*  K 3.9 3.6 3.7 3.5 3.9  CL 105 104 102 103 101  CO2 25 26 26 23 24   GLUCOSE 160* 141* 132* 122* 105*  BUN 17 11 11 11 14   CREATININE 0.96 1.00 1.05* 1.18* 1.12*  CALCIUM 8.7* 8.6* 8.9 8.4* 9.0  MG  --  2.4 2.1 2.1 2.2   Liver Function Tests: Recent Labs  Lab 02/12/20 1415 02/14/20 0359 02/15/20 0418 02/16/20 0328  AST 20 16 14* 13*  ALT 18 13 12 12   ALKPHOS 56 45 52 45  BILITOT 0.3 0.5 0.8 0.7  PROT 8.7* 7.1 7.5 6.6  ALBUMIN 4.6 3.9 4.2 3.6   Recent Labs  Lab 02/12/20 1415  LIPASE 23   No results for input(s): AMMONIA in the last 168 hours. CBC: Recent Labs  Lab 02/12/20 1415 02/12/20 1415 02/13/20 0326 02/14/20 0359 02/15/20 0418 02/16/20 0328 02/17/20 0558  WBC 9.2   < > 8.5 8.2 10.0 9.8 8.7  NEUTROABS 7.1  --   --   --   --   --   --   HGB 14.2   < > 12.7 12.7 12.5 11.6* 12.4  HCT 42.8   < > 39.1 39.6 37.8 34.9* 37.5  MCV 91.6   < > 94.2 94.5 92.2 91.6 92.1  PLT 325   < > 305 304 300 242 282   < > = values in this interval not displayed.   Cardiac Enzymes: No results for input(s): CKTOTAL, CKMB, CKMBINDEX, TROPONINI in the last 168 hours. BNP: Invalid input(s): POCBNP CBG: Recent Labs  Lab 02/16/20 0016 02/16/20 0959 02/16/20 1550 02/16/20 2047 02/17/20 0715  GLUCAP 141* 94 95 100* 104*   D-Dimer No results for input(s): DDIMER in the last 72 hours. Hgb A1c No results for input(s): HGBA1C in the last 72 hours. Lipid Profile No results for input(s): CHOL, HDL, LDLCALC,  TRIG, CHOLHDL, LDLDIRECT in the last 72 hours. Thyroid function studies No results for input(s): TSH, T4TOTAL, T3FREE, THYROIDAB in the last 72 hours.  Invalid input(s): FREET3 Anemia work up No results for input(s): VITAMINB12, FOLATE, FERRITIN, TIBC, IRON, RETICCTPCT in the last 72 hours. Urinalysis    Component Value Date/Time   COLORURINE YELLOW 02/12/2020 1415   APPEARANCEUR CLEAR 02/12/2020 1415   LABSPEC 1.010 02/12/2020 1415   PHURINE 7.5 02/12/2020 1415   GLUCOSEU NEGATIVE 02/12/2020 1415   HGBUR TRACE (A) 02/12/2020 1415   BILIRUBINUR NEGATIVE 02/12/2020 1415   BILIRUBINUR negative 01/18/2020 1122   KETONESUR NEGATIVE 02/12/2020 1415   PROTEINUR NEGATIVE 02/12/2020 1415  UROBILINOGEN 0.2 01/18/2020 1122   UROBILINOGEN 0.2 03/25/2015 1925   NITRITE NEGATIVE 02/12/2020 1415   LEUKOCYTESUR NEGATIVE 02/12/2020 1415   Sepsis Labs Invalid input(s): PROCALCITONIN,  WBC,  LACTICIDVEN Microbiology Recent Results (from the past 240 hour(s))  SARS Coronavirus 2 by RT PCR (hospital order, performed in [facility] hospital lab) Nasopharyngeal Nasopharyngeal Swab     Status: None   Collection Time: 02/12/20  2:15 PM   Specimen: Nasopharyngeal Swab  Result Value Ref Range Status   SARS Coronavirus 2 NEGATIVE NEGATIVE Final    Comment: (NOTE) SARS-CoV-2 target nucleic acids are NOT DETECTED. The SARS-CoV-2 RNA is generally detectable in upper and lower respiratory specimens during the acute phase of infection. The lowest concentration of SARS-CoV-2 viral copies this assay can detect is 250 copies / mL. A negative result does not preclude SARS-CoV-2 infection and should not be used as the sole basis for treatment or other patient management decisions.  A negative result may occur with improper specimen collection / handling, submission of specimen other than nasopharyngeal swab, presence of viral mutation(s) within the areas targeted by this assay, and inadequate number of viral  copies (<250 copies / mL). A negative result must be combined with clinical observations, patient history, and epidemiological information. Fact Sheet for Patients:   BoilerBrush.com.cy Fact Sheet for Healthcare Providers: https://pope.com/ This test is not yet approved or cleared  by the [place] FDA and has been authorized for detection and/or diagnosis of SARS-CoV-2 by FDA under an Emergency Use Authorization (EUA).  This EUA will remain in effect (meaning this test can be used) for the duration of the COVID-19 declaration under Section 564(b)(1) of the Act, 21 U.S.C. section 360bbb-3(b)(1), unless the authorization is terminated or revoked sooner. Performed at [facility], [address] [place], [place] [address]      Time coordinating discharge:  I have spent 35 minutes face to face with the patient and on the ward discussing the patients care, assessment, plan and disposition with other care givers. >50% of the time was devoted counseling the patient about the risks and benefits of treatment/Discharge disposition and coordinating care.   SIGNED:   Dimple Nanas, MD  [facility] 02/17/2020, 10:42 AM   If 7PM-7AM, please contact night-coverage

## 2020-03-01 ENCOUNTER — Ambulatory Visit (INDEPENDENT_AMBULATORY_CARE_PROVIDER_SITE_OTHER): Payer: 59 | Admitting: Sports Medicine

## 2020-03-01 ENCOUNTER — Encounter: Payer: Self-pay | Admitting: Sports Medicine

## 2020-03-01 DIAGNOSIS — K56609 Unspecified intestinal obstruction, unspecified as to partial versus complete obstruction: Secondary | ICD-10-CM | POA: Diagnosis not present

## 2020-03-01 DIAGNOSIS — M7989 Other specified soft tissue disorders: Secondary | ICD-10-CM | POA: Diagnosis not present

## 2020-03-01 NOTE — Progress Notes (Signed)
    Procedures performed today:    None.  Independent interpretation of notes and tests performed by another provider:   None.  Brief History, Exam, Impression, and Recommendations:    Right leg swelling Renee Murphy returns, she is a very pleasant 49 year old female with right leg swelling, knee pain. She gets occasional buckling, this is in spite of meloxicam, meniscal tear rehab exercises. Due to her medial joint line pain, mechanical symptoms we are going to proceed at this point to MRI. If she has a large meniscal tear she will likely need an arthroscopy, if not we can simply try an injection.  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  She has had 5 episodes of small bowel obstruction, needing hospitalization. She feels a little bit better now, she is passing gas and stool, but things are going slowly. I would certainly like her to return to general surgery in an outpatient setting, and discussed laparoscopic lysis of adhesions.    ___________________________________________ Ihor Austin. Benjamin Stain, M.D., ABFM., CAQSM. Primary Care and Sports Medicine [facility]  Adjunct Instructor of Family Medicine  [employer]

## 2020-03-01 NOTE — Assessment & Plan Note (Signed)
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  She has had 5 episodes of small bowel obstruction, needing hospitalization. She feels a little bit better now, she is passing gas and stool, but things are going slowly. I would certainly like her to return to general surgery in an outpatient setting, and discussed laparoscopic lysis of adhesions.

## 2020-03-01 NOTE — Assessment & Plan Note (Signed)
Renee Murphy returns, she is a very pleasant 49 year old female with right leg swelling, knee pain. She gets occasional buckling, this is in spite of meloxicam, meniscal tear rehab exercises. Due to her medial joint line pain, mechanical symptoms we are going to proceed at this point to MRI. If she has a large meniscal tear she will likely need an arthroscopy, if not we can simply try an injection.

## 2020-03-05 ENCOUNTER — Other Ambulatory Visit: Payer: Self-pay

## 2020-03-05 ENCOUNTER — Ambulatory Visit (INDEPENDENT_AMBULATORY_CARE_PROVIDER_SITE_OTHER): Payer: 59

## 2020-03-05 DIAGNOSIS — M7989 Other specified soft tissue disorders: Secondary | ICD-10-CM | POA: Diagnosis not present

## 2022-01-31 IMAGING — DX DG KNEE COMPLETE 4+V*R*
4 series · 4 of 4 positions shown · non-contrast
Comparison: None.

CLINICAL DATA: Intermittent right lateral knee pain and swelling
for the past 2 months.

EXAM:
RIGHT KNEE - COMPLETE 4+ VIEW

[knee ap]
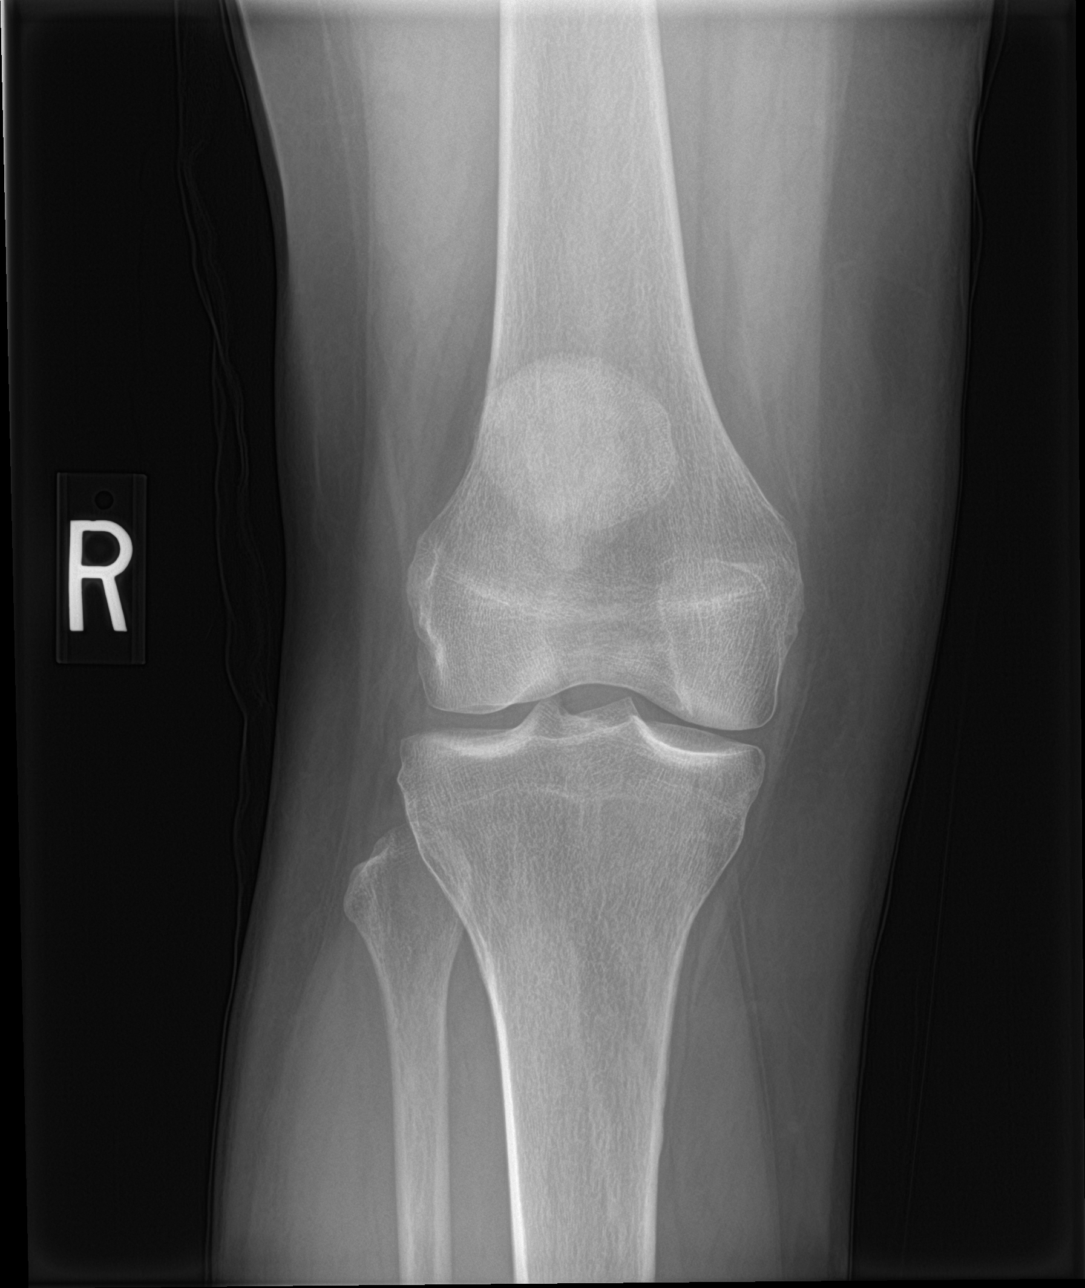

[knee lat]
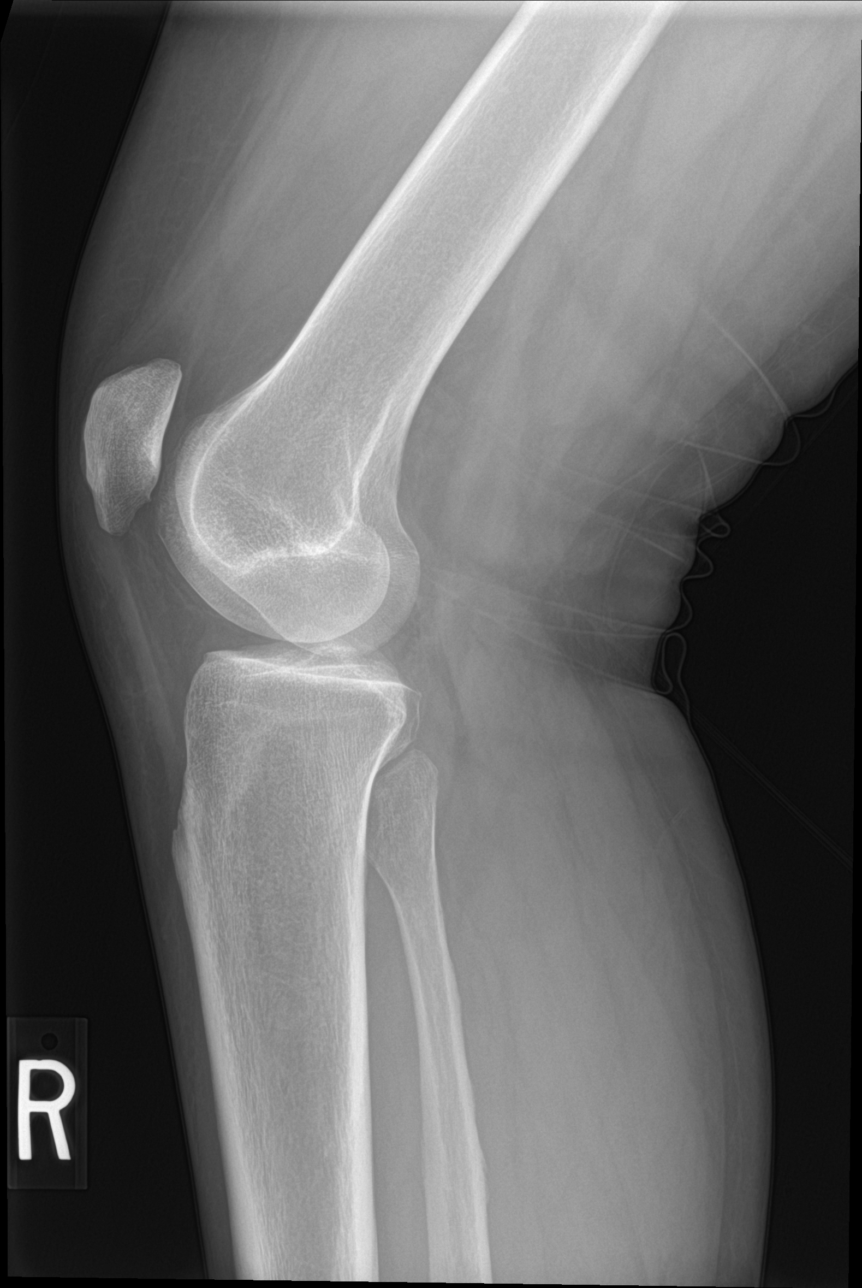

[knee obl (1 of 2)]
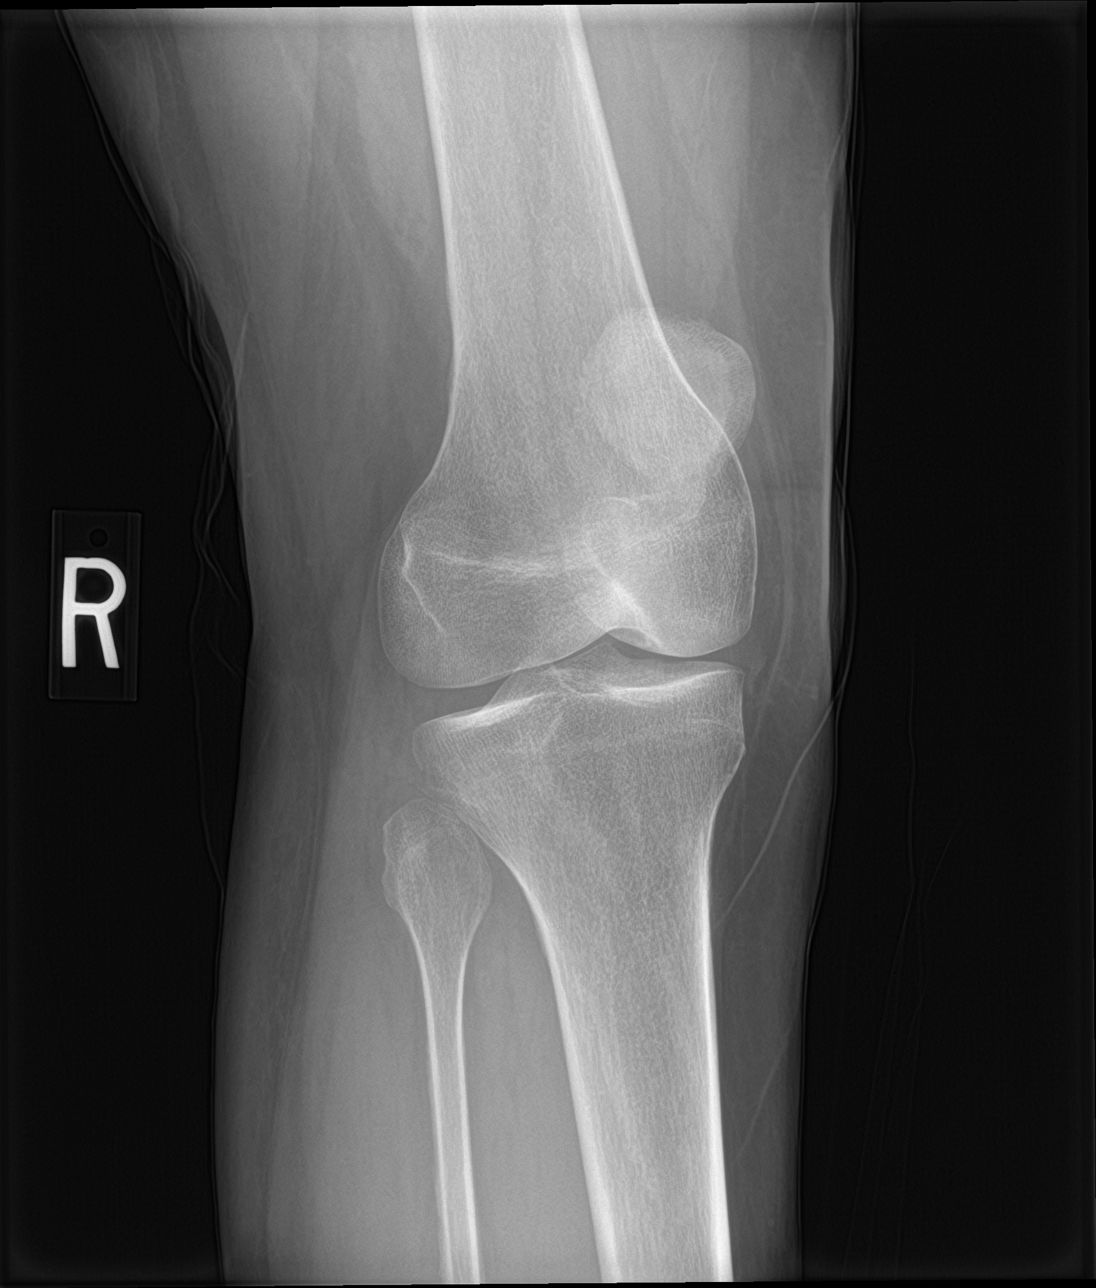

[knee obl (2 of 2)]
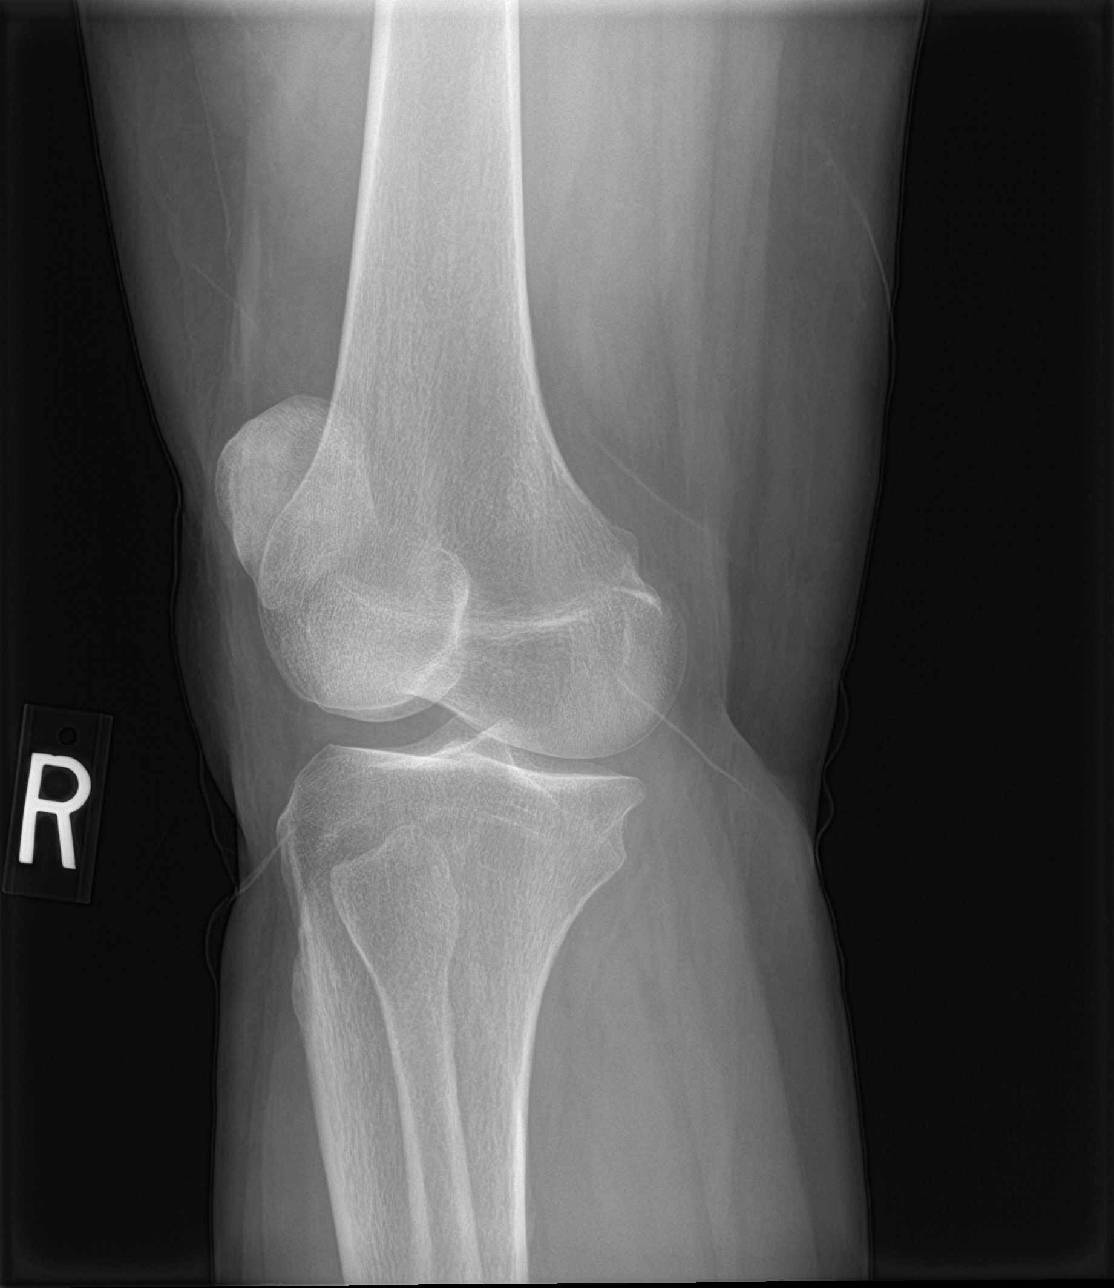

[4 of 4 positions shown; findings below may reference images not displayed]

FINDINGS: No evidence of fracture, dislocation, or joint effusion. No evidence
of arthropathy or other focal bone abnormality. Soft tissues are
unremarkable.
IMPRESSION: Negative.

## 2022-02-01 IMAGING — US US EXTREM LOW VENOUS*R*
1 series · 14 of 24 positions shown · non-contrast
Comparison: None.

CLINICAL DATA: Right lower extremity swelling. Homans sign is
present.

EXAM:
Right LOWER EXTREMITY VENOUS DOPPLER ULTRASOUND
TECHNIQUE: Gray-scale sonography with compression, as well as color and duplex
ultrasound, were performed to evaluate the deep venous system(s)
from the level of the common femoral vein through the popliteal and
proximal calf veins.

[Series 1: us extrem low venous*right* · 14 of 38 slices shown]
[im 1/38]
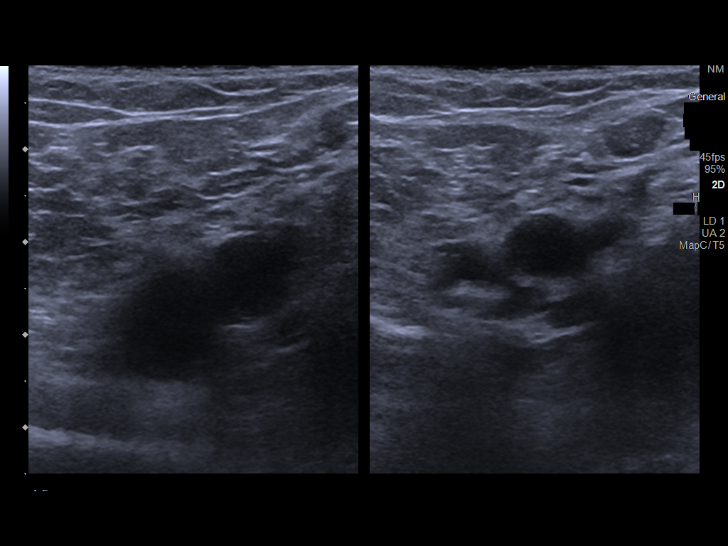
[im 4/38]
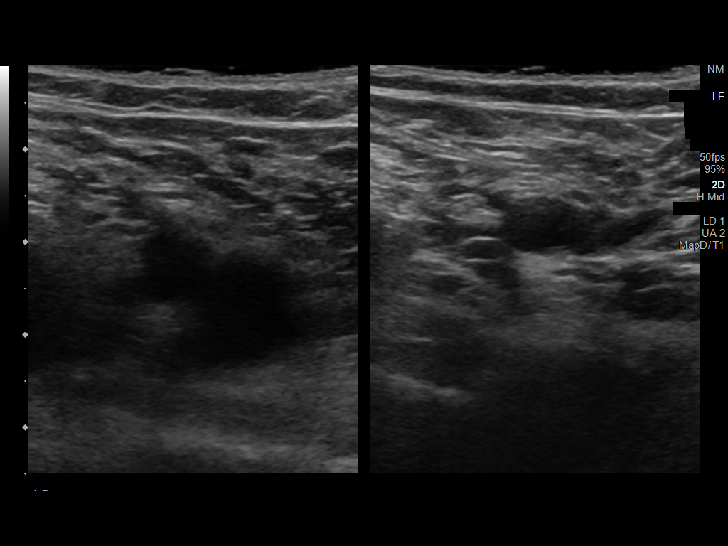
[im 7/38]
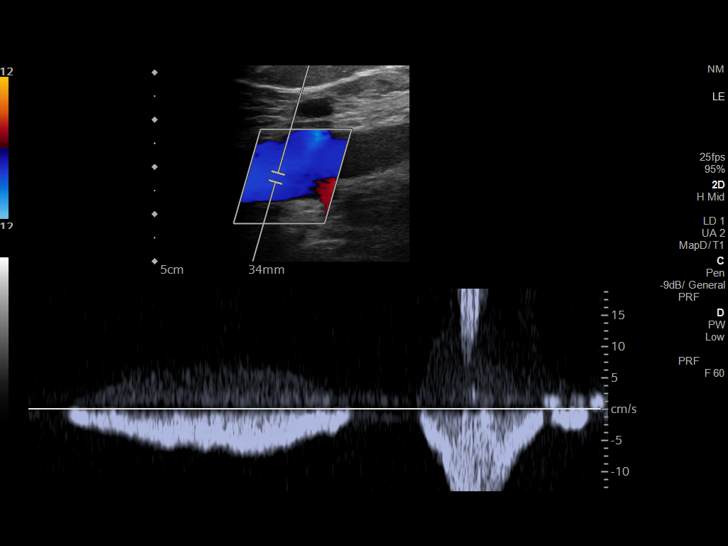
[im 10/38]
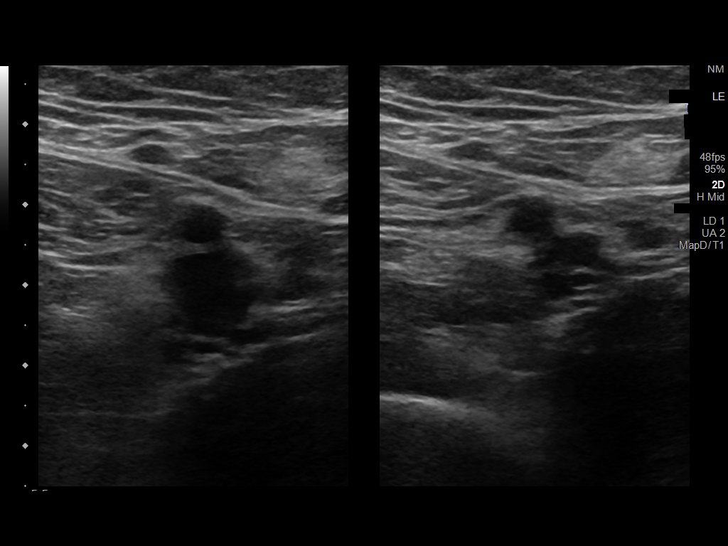
[im 12/38]
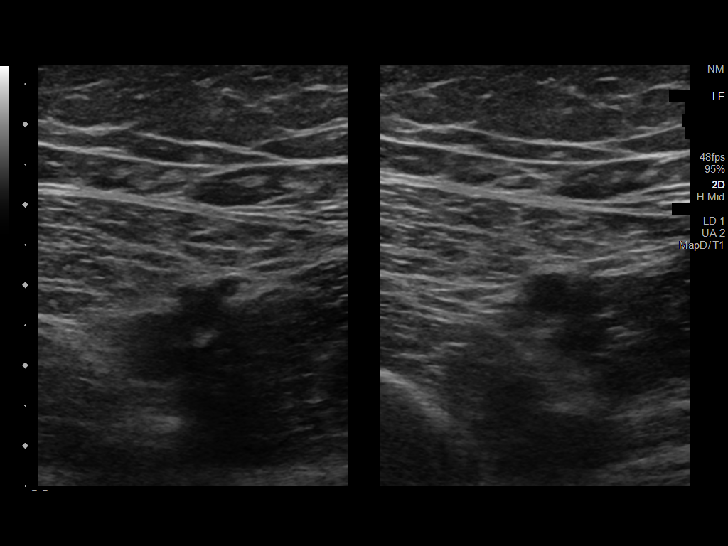
[im 15/38]
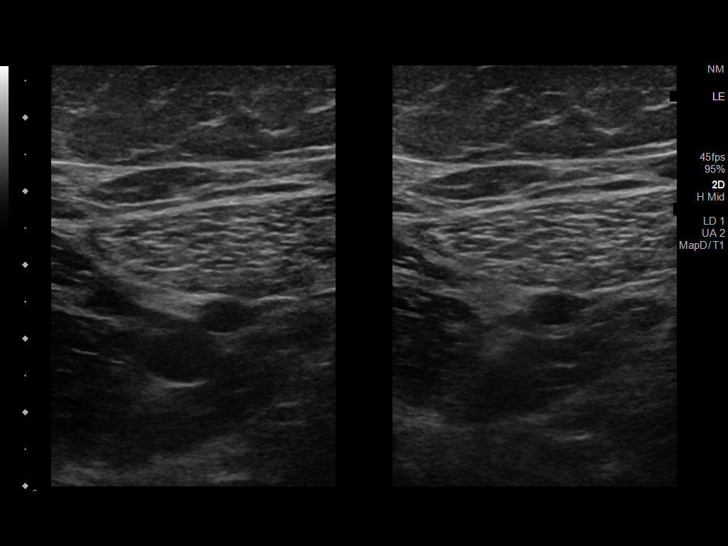
[im 18/38]
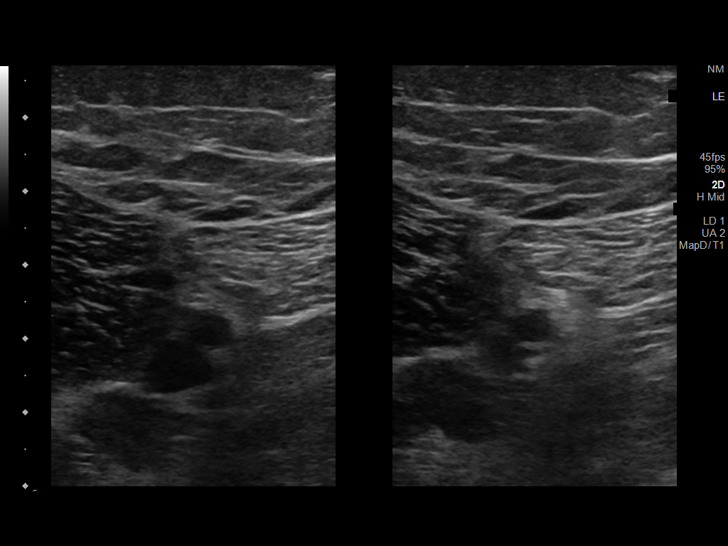
[im 20/38]
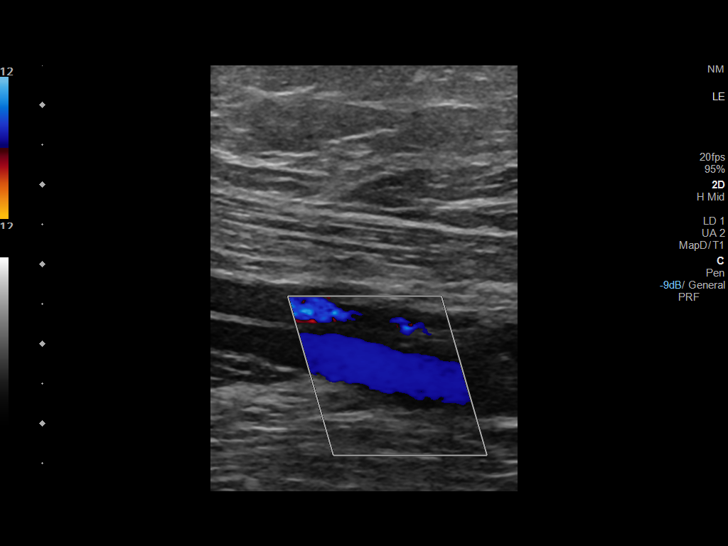
[im 23/38]
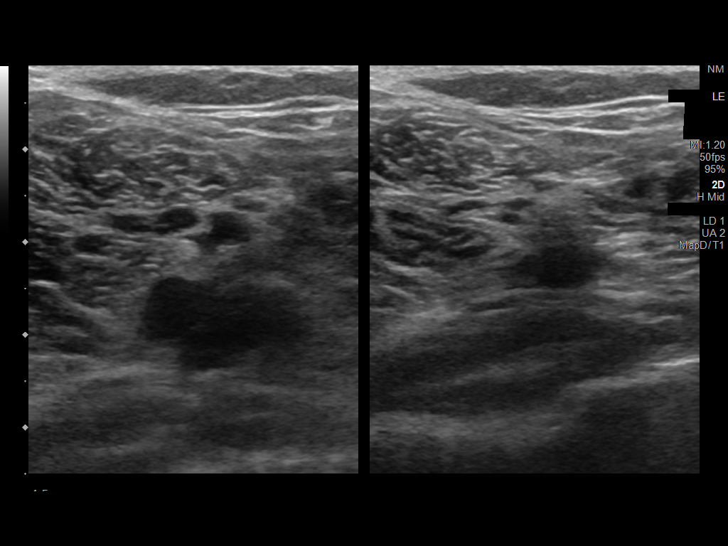
[im 26/38]
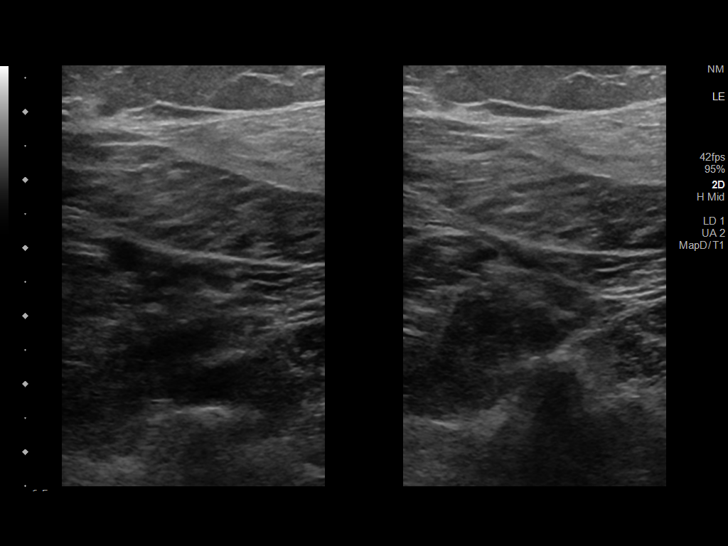
[im 29/38]
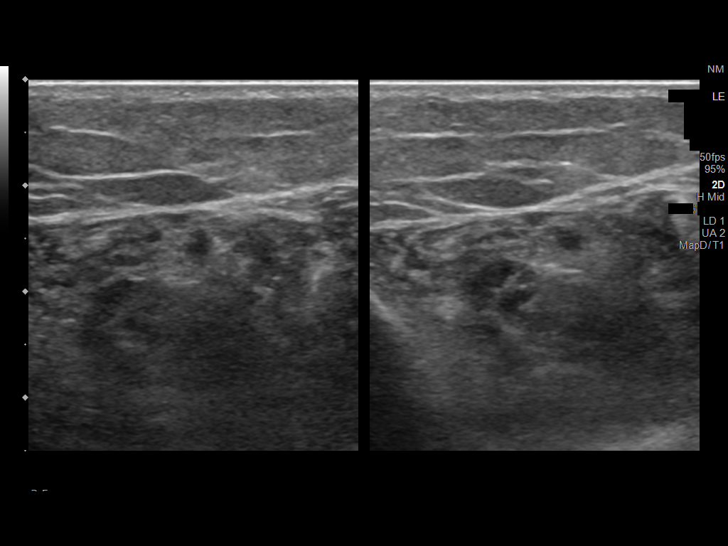
[im 31/38]
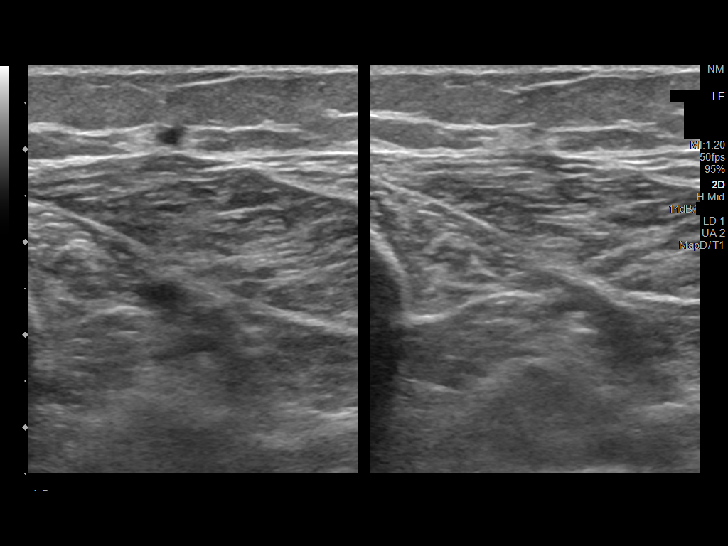
[im 34/38]
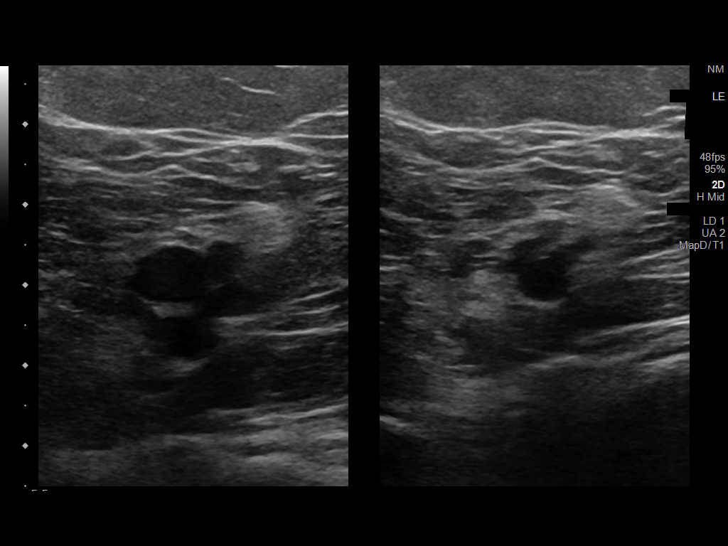
[im 38/38]
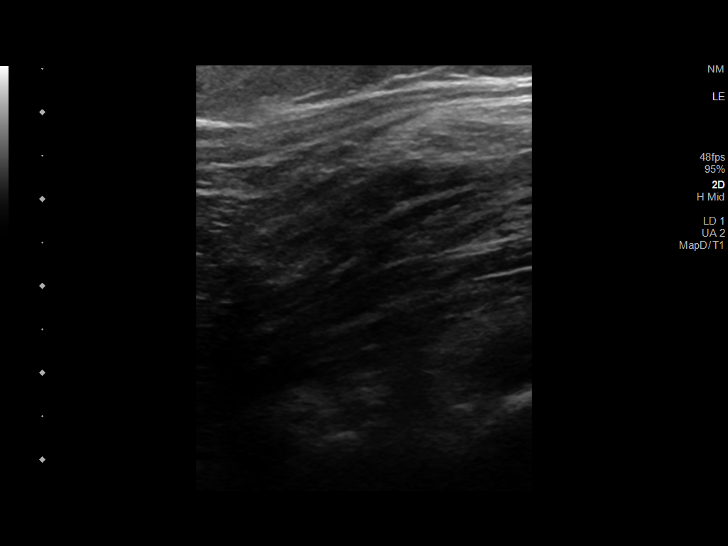

[14 of 24 positions shown; findings below may reference images not displayed]

FINDINGS: VENOUS

Normal compressibility of the common femoral, superficial femoral,
and popliteal veins, as well as the visualized calf veins.
Visualized portions of profunda femoral vein and great saphenous
vein unremarkable. No filling defects to suggest DVT on grayscale or
color Doppler imaging. Doppler waveforms show normal direction of
venous flow, normal respiratory plasticity and response to
augmentation.

Limited views of the contralateral common femoral vein are
unremarkable.

OTHER

None.

Limitations: none
IMPRESSION: No findings of right lower extremity DVT.

## 2024-01-05 ENCOUNTER — Observation Stay (HOSPITAL_BASED_OUTPATIENT_CLINIC_OR_DEPARTMENT_OTHER)
Admission: EM | Admit: 2024-01-05 | Discharge: 2024-01-08 | Disposition: A | Discharge status: Home / Self Care | Attending: Internal Medicine | Admitting: Internal Medicine

## 2024-01-05 ENCOUNTER — Emergency Department (HOSPITAL_BASED_OUTPATIENT_CLINIC_OR_DEPARTMENT_OTHER)

## 2024-01-05 ENCOUNTER — Other Ambulatory Visit: Payer: Self-pay

## 2024-01-05 ENCOUNTER — Encounter (HOSPITAL_BASED_OUTPATIENT_CLINIC_OR_DEPARTMENT_OTHER): Payer: Self-pay | Admitting: Pharmacy Technician

## 2024-01-05 DIAGNOSIS — K56609 Unspecified intestinal obstruction, unspecified as to partial versus complete obstruction: Secondary | ICD-10-CM | POA: Diagnosis not present

## 2024-01-05 DIAGNOSIS — N179 Acute kidney failure, unspecified: Secondary | ICD-10-CM | POA: Diagnosis not present

## 2024-01-05 DIAGNOSIS — R109 Unspecified abdominal pain: Secondary | ICD-10-CM | POA: Diagnosis present

## 2024-01-05 DIAGNOSIS — N289 Disorder of kidney and ureter, unspecified: Secondary | ICD-10-CM | POA: Insufficient documentation

## 2024-01-05 LAB — COMPREHENSIVE METABOLIC PANEL WITH GFR
ALT: 13 U/L (ref 0–44)
AST: 21 U/L (ref 15–41)
Albumin: 4.7 g/dL (ref 3.5–5.0)
Alkaline Phosphatase: 83 U/L (ref 38–126)
Anion gap: 12 (ref 5–15)
BUN: 20 mg/dL (ref 6–20)
CO2: 23 mmol/L (ref 22–32)
Calcium: 9.6 mg/dL (ref 8.9–10.3)
Chloride: 102 mmol/L (ref 98–111)
Creatinine, Ser: 1.2 mg/dL — ABNORMAL HIGH (ref 0.44–1.00)
GFR, Estimated: 55 mL/min — ABNORMAL LOW (ref >60)
Glucose, Bld: 129 mg/dL — ABNORMAL HIGH (ref 70–99)
Potassium: 4.4 mmol/L (ref 3.5–5.1)
Sodium: 136 mmol/L (ref 135–145)
Total Bilirubin: 0.3 mg/dL (ref 0.0–1.2)
Total Protein: 8 g/dL (ref 6.5–8.1)

## 2024-01-05 LAB — URINALYSIS, MICROSCOPIC (REFLEX)

## 2024-01-05 LAB — CBC WITH DIFFERENTIAL/PLATELET
Abs Immature Granulocytes: 0.03 10*3/uL (ref 0.00–0.07)
Basophils Absolute: 0 10*3/uL (ref 0.0–0.1)
Basophils Relative: 0 %
Eosinophils Absolute: 0 10*3/uL (ref 0.0–0.5)
Eosinophils Relative: 0 %
HCT: 41.4 % (ref 36.0–46.0)
Hemoglobin: 14.1 g/dL (ref 12.0–15.0)
Immature Granulocytes: 0 %
Lymphocytes Relative: 18 %
Lymphs Abs: 1.5 10*3/uL (ref 0.7–4.0)
MCH: 29.8 pg (ref 26.0–34.0)
MCHC: 34.1 g/dL (ref 30.0–36.0)
MCV: 87.5 fL (ref 80.0–100.0)
Monocytes Absolute: 0.3 10*3/uL (ref 0.1–1.0)
Monocytes Relative: 3 %
Neutro Abs: 6.8 10*3/uL (ref 1.7–7.7)
Neutrophils Relative %: 79 %
Platelets: 316 10*3/uL (ref 150–400)
RBC: 4.73 MIL/uL (ref 3.87–5.11)
RDW: 13 % (ref 11.5–15.5)
WBC: 8.6 10*3/uL (ref 4.0–10.5)
nRBC: 0 % (ref 0.0–0.2)

## 2024-01-05 LAB — LIPASE, BLOOD: Lipase: 14 U/L (ref 11–51)

## 2024-01-05 LAB — URINALYSIS, ROUTINE W REFLEX MICROSCOPIC
Bilirubin Urine: NEGATIVE
Glucose, UA: NEGATIVE mg/dL
Ketones, ur: NEGATIVE mg/dL
Leukocytes,Ua: NEGATIVE
Nitrite: NEGATIVE
Protein, ur: NEGATIVE mg/dL
Specific Gravity, Urine: 1.015 (ref 1.005–1.030)
pH: 7 (ref 5.0–8.0)

## 2024-01-05 LAB — MAGNESIUM: Magnesium: 2.3 mg/dL (ref 1.7–2.4)

## 2024-01-05 MED ORDER — ACETAMINOPHEN 650 MG RE SUPP: 650.0000 mg | Freq: Four times a day (QID) | RECTAL | Status: DC | PRN

## 2024-01-05 MED ORDER — MORPHINE SULFATE (PF) 2 MG/ML IV SOLN
2.0000 mg | INTRAVENOUS | Status: DC | PRN
  Administered 2024-01-05: 2 mg via INTRAVENOUS
  Filled 2024-01-05: qty 1

## 2024-01-05 MED ORDER — IOHEXOL 300 MG/ML  SOLN
100.0000 mL | Freq: Once | INTRAMUSCULAR | Status: AC | PRN
  Administered 2024-01-05: 100 mL via INTRAVENOUS

## 2024-01-05 MED ORDER — ENOXAPARIN SODIUM 40 MG/0.4ML IJ SOSY
40.0000 mg | PREFILLED_SYRINGE | INTRAMUSCULAR | Status: DC
  Administered 2024-01-05 – 2024-01-06 (×2): 40 mg via SUBCUTANEOUS
  Filled 2024-01-05 (×3): qty 0.4

## 2024-01-05 MED ORDER — HYDROMORPHONE HCL 1 MG/ML IJ SOLN
1.0000 mg | Freq: Once | INTRAMUSCULAR | Status: AC
  Administered 2024-01-05: 1 mg via INTRAVENOUS
  Filled 2024-01-05: qty 1

## 2024-01-05 MED ORDER — ONDANSETRON HCL 4 MG/2ML IJ SOLN
4.0000 mg | Freq: Once | INTRAMUSCULAR | Status: AC
  Administered 2024-01-05: 4 mg via INTRAVENOUS
  Filled 2024-01-05: qty 2

## 2024-01-05 MED ORDER — FAMOTIDINE IN NACL 20-0.9 MG/50ML-% IV SOLN
20.0000 mg | Freq: Once | INTRAVENOUS | Status: AC
  Administered 2024-01-05: 20 mg via INTRAVENOUS
  Filled 2024-01-05: qty 50

## 2024-01-05 MED ORDER — ONDANSETRON HCL 4 MG PO TABS
4.0000 mg | ORAL_TABLET | Freq: Four times a day (QID) | ORAL | Status: DC | PRN
Start: 2024-01-05 — End: 2024-01-08

## 2024-01-05 MED ORDER — ACETAMINOPHEN 325 MG PO TABS
650.0000 mg | ORAL_TABLET | Freq: Four times a day (QID) | ORAL | Status: DC | PRN
  Administered 2024-01-06 (×2): 650 mg via ORAL
  Filled 2024-01-05 (×2): qty 2

## 2024-01-05 MED ORDER — ONDANSETRON HCL 4 MG/2ML IJ SOLN
4.0000 mg | Freq: Four times a day (QID) | INTRAMUSCULAR | Status: DC | PRN
  Administered 2024-01-05 – 2024-01-06 (×3): 4 mg via INTRAVENOUS
  Filled 2024-01-05 (×3): qty 2

## 2024-01-05 MED ORDER — SODIUM CHLORIDE 0.9 % IV SOLN: INTRAVENOUS | Status: AC

## 2024-01-05 MED ORDER — ALBUTEROL SULFATE (2.5 MG/3ML) 0.083% IN NEBU: 2.5000 mg | INHALATION_SOLUTION | RESPIRATORY_TRACT | Status: DC | PRN

## 2024-01-05 MED ORDER — KETOROLAC TROMETHAMINE 30 MG/ML IJ SOLN
30.0000 mg | Freq: Once | INTRAMUSCULAR | Status: AC
  Administered 2024-01-05: 30 mg via INTRAVENOUS
  Filled 2024-01-05: qty 1

## 2024-01-05 MED ORDER — DIATRIZOATE MEGLUMINE & SODIUM 66-10 % PO SOLN
90.0000 mL | Freq: Once | ORAL | Status: AC
  Administered 2024-01-05: 90 mL via ORAL
  Filled 2024-01-05: qty 90

## 2024-01-05 NOTE — ED Triage Notes (Signed)
 Pt here POV with complaints of generalized abdominal pain. Pain radiates into pelvic region and into back. Pain started last night. Today pt with vomiting. Similar symptoms several weeks ago. Changed her diet and symptoms resolved. Hx SBO. LBM this morning.

## 2024-01-05 NOTE — ED Notes (Signed)
 [facility] called for transport, No Current ETA ED Nurse has paper work [facility] @ 15:28

## 2024-01-05 NOTE — ED Notes (Signed)
 Received call from [facility] will arrive within 15-20 mins to transport patient out. Received Call @ 15:44

## 2024-01-05 NOTE — H&P (Signed)
 History and Physical  Renee Murphy WRU:[id number] DOB: [birth year] DOA: 01/05/2024  PCP: Amadeo June, MD   Chief Complaint: Abdominal pain, vomiting  HPI: Renee Murphy is a 53 y.o. 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  Patient states she was in her usual state of health until 2 days ago when after dinner she felt a little bit bloated with some slight pain, but it seemed to get better.  Yesterday, she was not feeling great, tried to eat a small meal with, noticed that she had some abdominal pain when she tried to ambulate.  Early this morning about 4 AM she was awoken from sleep with severe lower abdominal pain which is typical of her bowel obstructions.  She also had associated nausea.  She had a bowel movement after the pain started, it was of normal caliber but otherwise normal.  After this, she started having worsening abdominal pain, and went to the ER she vomited.  She denies any fevers or chills, her only prescription medication is vitamin D .  She currently has some mild nausea, no significant nausea and no significant abdominal pain.  Politely declined NG tube.  Review of Systems: Please see HPI for pertinent positives and negatives. A complete 10 system review of systems are otherwise negative.  Past Medical History:  Diagnosis Date   Bowel obstruction (HCC)    Chronic pain of left ankle    Intestinal adhesions with obstruction [facility])    Past Surgical History:  Procedure Laterality Date   ABDOMINAL HYSTERECTOMY     HERNIA REPAIR     URETHRA SURGERY     Social History:  reports that she has never smoked. She has never used smokeless tobacco. She reports that she does not drink alcohol and does not use drugs.  Allergies  Allergen Reactions   Aspirin Nausea And Vomiting    Family  History  Problem Relation Age of Onset   Diabetes Mother    Cancer Father      Prior to Admission medications   Medication Sig Start Date End Date Taking? Authorizing Provider  acetaminophen  (TYLENOL ) 325 MG tablet Take 650 mg by mouth every 6 (six) hours as needed for moderate pain.    Yes [provider]  Caraway Oil-Levomenthol (FDGARD PO) Take 1 capsule by mouth daily.   Yes [provider]  Cetirizine HCl (ZYRTEC PO) Take 1 tablet by mouth daily.   Yes [provider]  Multiple Vitamin (MULTIVITAMIN ADULT PO) Take 1 each by mouth daily.   Yes [provider]  traMADol (ULTRAM) 50 MG tablet Take 50 mg by mouth every 12 (twelve) hours as needed for moderate pain (pain score 4-6) or severe pain (pain score 7-10).   Yes [provider]  Vitamin D , Ergocalciferol , (DRISDOL) 1.25 MG (50000 UNIT) CAPS capsule Take 50,000 Units by mouth once a week. 11/18/23  Yes [provider]  ondansetron  (ZOFRAN  ODT) 4 MG disintegrating tablet Take 1 tablet (4 mg total) by mouth every 8 (eight) hours as needed for nausea or vomiting. 02/17/20   Maggie Schooner, MD    Physical Exam: BP (!) 154/82 (BP Location: Right Arm)   Pulse 66   Temp 98 F (36.7 C) (Oral)   Resp 17   Ht 5\' 9"  (1.753 m)   Wt 83.1 kg   SpO2 99%   BMI 27.06 kg/m  General:  Alert, oriented, calm, in no acute distress husband and daughter are at the bedside, she looks quite comfortable and nontoxic Cardiovascular: RRR, no murmurs or rubs, no peripheral edema  Respiratory: clear to auscultation bilaterally, no wheezes, no crackles  Abdomen: soft, diffusely tender, distended and tympanic, the patient states that it is much improved from earlier this morning, normal bowel tones heard  Skin: dry, no rashes  Musculoskeletal: no joint effusions, normal range of motion  Psychiatric: appropriate affect, normal speech  Neurologic: extraocular muscles intact, clear speech, moving all  extremities with intact sensorium         Labs on Admission:  Basic Metabolic Panel: Recent Labs  Lab 01/05/24 1054  NA 136  K 4.4  CL 102  CO2 23  GLUCOSE 129*  BUN 20  CREATININE 1.20*  CALCIUM 9.6  MG 2.3   Liver Function Tests: Recent Labs  Lab 01/05/24 1054  AST 21  ALT 13  ALKPHOS 83  BILITOT 0.3  PROT 8.0  ALBUMIN 4.7   Recent Labs  Lab 01/05/24 1054  LIPASE 14   No results for input(s): "AMMONIA" in the last 168 hours. CBC: Recent Labs  Lab 01/05/24 1054  WBC 8.6  NEUTROABS 6.8  HGB 14.1  HCT 41.4  MCV 87.5  PLT 316   Cardiac Enzymes: No results for input(s): "CKTOTAL", "CKMB", "CKMBINDEX", "TROPONINI" in the last 168 hours. BNP (last 3 results) No results for input(s): "BNP" in the last 8760 hours.  ProBNP (last 3 results) No results for input(s): "PROBNP" in the last 8760 hours.  CBG: No results for input(s): "GLUCAP" in the last 168 hours.  Radiological Exams on Admission: CT ABDOMEN PELVIS W CONTRAST Result Date: 01/05/2024 CLINICAL DATA:  Evaluate bowel obstruction. History of small-bowel obstruction with recurrent symptoms. EXAM: CT ABDOMEN AND PELVIS WITH CONTRAST TECHNIQUE: Multidetector CT imaging of the abdomen and pelvis was performed using the standard protocol following bolus administration of intravenous contrast. RADIATION DOSE REDUCTION: This exam was performed according to the departmental dose-optimization program which includes automated exposure control, adjustment of the mA and/or kV according to patient size and/or use of iterative reconstruction technique. CONTRAST:  OMNIPAQUE  IOHEXOL  300 MG/ML  SOLN COMPARISON:  02/12/2020 FINDINGS: Lower chest: No acute abnormality. Hepatobiliary: No focal liver abnormality is seen. No gallstones, gallbladder wall thickening, or biliary dilatation. Pancreas: Small cyst within the liver are again seen the largest is along the dome measuring 1.5 cm, image 6/2. The gallbladder appears  normal. No bile duct dilatation. Spleen: Normal in size without focal abnormality. Adrenals/Urinary Tract: Adrenal glands are unremarkable. Normal appearance of the left kidney. There is scarring and mild atrophy involving the left kidney. Urinary bladder demonstrates no significant findings. Stomach/Bowel: Small hiatal hernia. The proximal small bowel loops appear nondilated. The mid small bowel loops are dilated measuring up to 3 0.1 cm, image 50/2. Decreased caliber of the distal small bowel. Transition to decreased caliber distal small bowel is noted within the ventral pelvis, image 64/7 and image 71/2. The appendix is visualized and appears normal. Normal appearance of the colon. Vascular/Lymphatic: Normal appearance of the abdominal aorta. Upper abdominal vascularity appears patent. Reproductive: No abdominopelvic adenopathy. Other: Signs of previous ventral abdominal wall herniorrhaphy. No free fluid or fluid collections. No signs of pneumoperitoneum. Musculoskeletal: No acute or significant osseous findings. IMPRESSION: 1. Examination is positive for small bowel obstruction with transition point within the ventral pelvis. 2. Small hiatal hernia. 3. Scarring and mild atrophy involving the left kidney. Electronically Signed  By: Kimberley Penman M.D.   On: 01/05/2024 13:05   Assessment/Plan Renee Murphy is a 53 y.o. 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   Small bowel obstruction-with transition point within the ventral pelvis.  Currently patient is refusing NG tube but comfortable without significant nausea. -Inpatient admission -Pain and nausea control as needed -IV fluids -General Surgery Dr. Afton Horse has been consulted  Renal insufficiency-mild insufficiency compared to prior baseline renal function, GFR is  currently 55 -Avoid nephrotoxins as able -Hydrate as above -Follow renal function with labs  DVT prophylaxis: Lovenox      Code Status: Full Code  Consults called: General Surgery  Admission status: The appropriate patient status for this patient is INPATIENT. Inpatient status is judged to be reasonable and necessary in order to provide the required intensity of service to ensure the patient's safety. The patient's presenting symptoms, physical exam findings, and initial radiographic and laboratory data in the context of their chronic comorbidities is felt to place them at high risk for further clinical deterioration. Furthermore, it is not anticipated that the patient will be medically stable for discharge from the hospital within 2 midnights of admission.    I certify that at the point of admission it is my clinical judgment that the patient will require inpatient hospital care spanning beyond 2 midnights from the point of admission due to high intensity of service, high risk for further deterioration and high frequency of surveillance required  Time spent: 55 minutes  Renee Stohr Rickey Charm MD [facility] Pager [phone]  If 7PM-7AM, please contact night-coverage [email or web address] Password [email or web address]  01/05/2024, 5:43 PM

## 2024-01-05 NOTE — ED Provider Notes (Signed)
 [place] [facility] Provider Note   CSN: [id number] Arrival date & time: 01/05/24  0944     History {Add pertinent medical, surgical, social history, OB history to HPI:1} Chief Complaint  Patient presents with   Abdominal Pain    Renee Murphy is a 53 y.o. female with a history of hysterectomy, bowel obstruction, presented to ED with complaint of lower abdominal pain.  Patient reports that about 2 weeks ago she had symptoms of cramping lower abdominal pain that feels reminiscent of when she had a bowel obstruction.  She was able to wean herself onto a liquid diet and the pain seemed to ease up over the next couple days.  However the pain abruptly returned last night and is severe.  It is cramping pain in her pelvis that she feels into her legs as well which she says is reminiscent of her prior bowel obstructions.  She has had nausea and several episodes of vomiting overnight.  She reports had a small bowel movement earlier this morning but has not been having regular bowel movements over the past week.  She feels she is probably passing gas.  I reviewed her external records.  As most recent hospitalization for bowel obstruction was in 2021.  That time she was noted have a history of hysterectomy, hernia repair, urethral surgery, and small bowel obstructions.  She had an NG tube placed per surgery recommendations.  She was treated with conservative management until she was passing gas and discharged home.  CT imaging at that time showed distal small bowel obstruction with suspected transition point in the pelvis anterior to the bladder  HPI     Home Medications Prior to Admission medications   Medication Sig Start Date End Date Taking? Authorizing Provider  acetaminophen  (TYLENOL ) 325 MG tablet Take 650 mg by mouth every 6 (six) hours as needed for moderate pain.     [provider]  albuterol  (VENTOLIN  HFA) 108 (90 Base) MCG/ACT inhaler  Inhale 1 puff into the lungs every 6 (six) hours as needed for wheezing or shortness of breath.  10/19/19   [provider]  FLOVENT HFA 110 MCG/ACT inhaler Inhale 1 puff into the lungs 2 (two) times daily.  11/09/19   [provider]  ondansetron  (ZOFRAN  ODT) 4 MG disintegrating tablet Take 1 tablet (4 mg total) by mouth every 8 (eight) hours as needed for nausea or vomiting. 02/17/20   Maggie Schooner, MD      Allergies    Aspirin    Review of Systems   Review of Systems  Physical Exam Updated Vital Signs BP (!) 183/96   Pulse 82   Temp 97.9 F (36.6 C)   Resp 16   SpO2 94%  Physical Exam Constitutional:      General: She is not in acute distress. HENT:     Head: Normocephalic and atraumatic.  Eyes:     Conjunctiva/sclera: Conjunctivae normal.     Pupils: Pupils are equal, round, and reactive to light.  Cardiovascular:     Rate and Rhythm: Normal rate and regular rhythm.  Pulmonary:     Effort: Pulmonary effort is normal. No respiratory distress.  Abdominal:     General: There is no distension.     Tenderness: There is generalized abdominal tenderness.  Skin:    General: Skin is warm and dry.  Neurological:     General: No focal deficit present.     Mental Status: She is alert.  Mental status is at baseline.  Psychiatric:        Mood and Affect: Mood normal.        Behavior: Behavior normal.     ED Results / Procedures / Treatments   Labs (all labs ordered are listed, but only abnormal results are displayed) Labs Reviewed - No data to display  EKG None  Radiology No results found.  Procedures Procedures  {Document cardiac monitor, telemetry assessment procedure when appropriate:1}  Medications Ordered in ED Medications - No data to display  ED Course/ Medical Decision Making/ A&P   {   Click here for ABCD2, HEART and other calculatorsREFRESH Note before signing :1}                              Medical Decision Making  This patient  presents to the ED with concern for nausea, vomiting, abdominal pain. This involves an extensive number of treatment options, and is a complaint that carries with it a high risk of complications and morbidity.  The differential diagnosis includes recurring bowel obstruction versus ileus versus UTI versus other intra-abdominal process  Co-morbidities that complicate the patient evaluation: History of prior multiple abdominal surgeries, risk of adhesions and bowel obstruction  External records from outside source obtained and reviewed including hospital discharge summary from 2021, admission for bowel obstruction at the time  I ordered and personally interpreted labs.  The pertinent results include:  ***  I ordered imaging studies including CT abdomen pelvis I independently visualized and interpreted imaging which showed *** I agree with the radiologist interpretation  I ordered medication including IV pain and nausea medications, IV Pepcid for vomiting gastritis pain  I have reviewed the patients home medicines and have made adjustments as needed  Test Considered: ***  I requested consultation with the ***,  and discussed lab and imaging findings as well as pertinent plan - they recommend: ***  After the interventions noted above, I reevaluated the patient and found that they have: {resolved/improved/worsened:23923::"improved"}  Social Determinants of Health:***  Dispostion:  After consideration of the diagnostic results and the patients response to treatment, I feel that the patent would benefit from ***.   {Document critical care time when appropriate:1} {Document review of labs and clinical decision tools ie heart score, Chads2Vasc2 etc:1}  {Document your independent review of radiology images, and any outside records:1} {Document your discussion with family members, caretakers, and with consultants:1} {Document social determinants of health affecting pt's care:1} {Document your  decision making why or why not admission, treatments were needed:1} Final Clinical Impression(s) / ED Diagnoses Final diagnoses:  None    Rx / DC Orders ED Discharge Orders     None

## 2024-01-05 NOTE — Consult Note (Signed)
 Reason for Consult: Abdominal pain Referring Physician: Jannette Mend MD  Renee Murphy is an 53 y.o. female.  HPI: 53 year old female with a past medical history of multiple abdominal operations with a 2-day history of mid abdominal pain sharp in nature with nausea and vomiting.  Last bowel movement was yesterday.  She had more vomiting this morning but that has since stopped and she has had no further vomiting since 10:00 this morning.  She was found on CT scan to have findings consistent with a small bowel obstruction.  She has a history of small bowel obstruction in the past.  These have resolved nonoperatively.  She has had hysterectomy and a ventral hernia repair in the past  Past Medical History:  Diagnosis Date   Bowel obstruction (HCC)    Chronic pain of left ankle    Intestinal adhesions with obstruction [facility])     Past Surgical History:  Procedure Laterality Date   ABDOMINAL HYSTERECTOMY     HERNIA REPAIR     URETHRA SURGERY      Family History  Problem Relation Age of Onset   Diabetes Mother    Cancer Father     Social History:  reports that she has never smoked. She has never used smokeless tobacco. She reports that she does not drink alcohol and does not use drugs.  Allergies:  Allergies  Allergen Reactions   Aspirin Nausea And Vomiting    Medications: I have reviewed the patient's current medications.  Results for orders placed or performed during the hospital encounter of 01/05/24 (from the past 48 hours)  Urinalysis, Routine w reflex microscopic -Urine, Clean Catch     Status: Abnormal   Collection Time: 01/05/24 10:25 AM  Result Value Ref Range   Color, Urine YELLOW YELLOW   APPearance CLEAR CLEAR   Specific Gravity, Urine 1.015 1.005 - 1.030   pH 7.0 5.0 - 8.0   Glucose, UA NEGATIVE NEGATIVE mg/dL   Hgb urine dipstick TRACE (A) NEGATIVE   Bilirubin Urine NEGATIVE NEGATIVE   Ketones, ur NEGATIVE NEGATIVE mg/dL   Protein, ur NEGATIVE NEGATIVE mg/dL    Nitrite NEGATIVE NEGATIVE   Leukocytes,Ua NEGATIVE NEGATIVE    Comment: Performed at [facility], [address] [place], [place] [address]  Urinalysis, Microscopic (reflex)     Status: Abnormal   Collection Time: 01/05/24 10:25 AM  Result Value Ref Range   RBC / HPF 0-5 0 - 5 RBC/hpf   WBC, UA 0-5 0 - 5 WBC/hpf   Bacteria, UA RARE (A) NONE SEEN   Squamous Epithelial / HPF 0-5 0 - 5 /HPF    Comment: Performed at [facility], [address] [place], [place] [address]  Comprehensive metabolic panel     Status: Abnormal   Collection Time: 01/05/24 10:54 AM  Result Value Ref Range   Sodium 136 135 - 145 mmol/L   Potassium 4.4 3.5 - 5.1 mmol/L   Chloride 102 98 - 111 mmol/L   CO2 23 22 - 32 mmol/L   Glucose, Bld 129 (H) 70 - 99 mg/dL    Comment: Glucose reference range applies only to samples taken after fasting for at least 8 hours.   BUN 20 6 - 20 mg/dL   Creatinine, Ser 3.08 (H) 0.44 - 1.00 mg/dL   Calcium 9.6 8.9 - 65.7 mg/dL   Total Protein 8.0 6.5 - 8.1 g/dL   Albumin 4.7 3.5 - 5.0 g/dL   AST 21 15 - 41  U/L   ALT 13 0 - 44 U/L   Alkaline Phosphatase 83 38 - 126 U/L   Total Bilirubin 0.3 0.0 - 1.2 mg/dL   GFR, Estimated 55 (L) >60 mL/min    Comment: (NOTE) Calculated using the CKD-EPI Creatinine Equation (2021)    Anion gap 12 5 - 15    Comment: Performed at [facility], [address] [place], [place] [address]  CBC with Differential     Status: None   Collection Time: 01/05/24 10:54 AM  Result Value Ref Range   WBC 8.6 4.0 - 10.5 K/uL   RBC 4.73 3.87 - 5.11 MIL/uL   Hemoglobin 14.1 12.0 - 15.0 g/dL   HCT 29.5 28.4 - 13.2 %   MCV 87.5 80.0 - 100.0 fL   MCH 29.8 26.0 - 34.0 pg   MCHC 34.1 30.0 - 36.0 g/dL   RDW 44.0 10.2 - 72.5 %   Platelets 316 150 - 400 K/uL   nRBC 0.0 0.0 - 0.2 %   Neutrophils Relative % 79 %   Neutro Abs 6.8 1.7 - 7.7 K/uL   Lymphocytes Relative 18 %   Lymphs Abs 1.5 0.7 - 4.0 K/uL   Monocytes Relative  3 %   Monocytes Absolute 0.3 0.1 - 1.0 K/uL   Eosinophils Relative 0 %   Eosinophils Absolute 0.0 0.0 - 0.5 K/uL   Basophils Relative 0 %   Basophils Absolute 0.0 0.0 - 0.1 K/uL   Immature Granulocytes 0 %   Abs Immature Granulocytes 0.03 0.00 - 0.07 K/uL    Comment: Performed at [facility], [address] [place], [place] [address]  Lipase, blood     Status: None   Collection Time: 01/05/24 10:54 AM  Result Value Ref Range   Lipase 14 11 - 51 U/L    Comment: Performed at [facility], [address] [place], [place] [address]  Magnesium     Status: None   Collection Time: 01/05/24 10:54 AM  Result Value Ref Range   Magnesium 2.3 1.7 - 2.4 mg/dL    Comment: Performed at [facility], [address] [place], [place] [address]    CT ABDOMEN PELVIS W CONTRAST Result Date: 01/05/2024 CLINICAL DATA:  Evaluate bowel obstruction. History of small-bowel obstruction with recurrent symptoms. EXAM: CT ABDOMEN AND PELVIS WITH CONTRAST TECHNIQUE: Multidetector CT imaging of the abdomen and pelvis was performed using the standard protocol following bolus administration of intravenous contrast. RADIATION DOSE REDUCTION: This exam was performed according to the departmental dose-optimization program which includes automated exposure control, adjustment of the mA and/or kV according to patient size and/or use of iterative reconstruction technique. CONTRAST:  OMNIPAQUE  IOHEXOL  300 MG/ML  SOLN COMPARISON:  02/12/2020 FINDINGS: Lower chest: No acute abnormality. Hepatobiliary: No focal liver abnormality is seen. No gallstones, gallbladder wall thickening, or biliary dilatation. Pancreas: Small cyst within the liver are again seen the largest is along the dome measuring 1.5 cm, image 6/2. The gallbladder appears normal. No bile duct dilatation. Spleen: Normal in size without focal abnormality. Adrenals/Urinary Tract: Adrenal glands are unremarkable. Normal appearance of  the left kidney. There is scarring and mild atrophy involving the left kidney. Urinary bladder demonstrates no significant findings. Stomach/Bowel: Small hiatal hernia. The proximal small bowel loops appear nondilated. The mid small bowel loops are dilated measuring up to 3 0.1 cm, image 50/2. Decreased caliber of the distal small bowel. Transition to decreased caliber  distal small bowel is noted within the ventral pelvis, image 64/7 and image 71/2. The appendix is visualized and appears normal. Normal appearance of the colon. Vascular/Lymphatic: Normal appearance of the abdominal aorta. Upper abdominal vascularity appears patent. Reproductive: No abdominopelvic adenopathy. Other: Signs of previous ventral abdominal wall herniorrhaphy. No free fluid or fluid collections. No signs of pneumoperitoneum. Musculoskeletal: No acute or significant osseous findings. IMPRESSION: 1. Examination is positive for small bowel obstruction with transition point within the ventral pelvis. 2. Small hiatal hernia. 3. Scarring and mild atrophy involving the left kidney. Electronically Signed   By: Kimberley Penman M.D.   On: 01/05/2024 13:05    Review of Systems  Gastrointestinal:  Positive for abdominal pain, nausea and vomiting.  All other systems reviewed and are negative.  Blood pressure (!) 154/82, pulse 66, temperature 98 F (36.7 C), temperature source Oral, resp. rate 17, height 5\' 9"  (1.753 m), weight 83.1 kg, SpO2 99%. Physical Exam Vitals reviewed.  HENT:     Head: Normocephalic.  Abdominal:     General: There is distension.     Palpations: Abdomen is soft.     Tenderness: There is abdominal tenderness in the periumbilical area.     Hernia: No hernia is present.  Musculoskeletal:        General: Normal range of motion.  Skin:    General: Skin is warm and dry.  Neurological:     General: No focal deficit present.     Mental Status: She is alert.  Psychiatric:        Mood and Affect: Mood normal.      Assessment/Plan: Small bowel obstruction-patient has a history of these and these are resolved nonoperatively.  She is not vomiting anymore and her distention is quite minimal.  Also on exam at this point in time her pain is quite minimal.  Will start the small bowel protocol and give her the agents orally.  If she vomits, that we will have to stop and she will have to reconsider NG tube.  She is against NG tube placement for now but explained to her she might need 1 if she develops worsening symptoms.  She has a nonacute abdomen at this point in time.  Recommend IV fluids and n.p.o. status.  Surgery to follow.  Yavuz Kirby A Helen Cuff 01/05/2024, 7:01 PM   High complexity

## 2024-01-06 ENCOUNTER — Inpatient Hospital Stay (HOSPITAL_COMMUNITY)

## 2024-01-06 DIAGNOSIS — N179 Acute kidney failure, unspecified: Secondary | ICD-10-CM | POA: Diagnosis not present

## 2024-01-06 DIAGNOSIS — K56609 Unspecified intestinal obstruction, unspecified as to partial versus complete obstruction: Secondary | ICD-10-CM | POA: Diagnosis not present

## 2024-01-06 LAB — BASIC METABOLIC PANEL WITH GFR
Anion gap: 8 (ref 5–15)
BUN: 19 mg/dL (ref 6–20)
CO2: 24 mmol/L (ref 22–32)
Calcium: 8.8 mg/dL — ABNORMAL LOW (ref 8.9–10.3)
Chloride: 105 mmol/L (ref 98–111)
Creatinine, Ser: 1.06 mg/dL — ABNORMAL HIGH (ref 0.44–1.00)
GFR, Estimated: >60 mL/min (ref >60)
Glucose, Bld: 135 mg/dL — ABNORMAL HIGH (ref 70–99)
Potassium: 4.2 mmol/L (ref 3.5–5.1)
Sodium: 137 mmol/L (ref 135–145)

## 2024-01-06 LAB — CBC
HCT: 41.4 % (ref 36.0–46.0)
Hemoglobin: 13.2 g/dL (ref 12.0–15.0)
MCH: 29.5 pg (ref 26.0–34.0)
MCHC: 31.9 g/dL (ref 30.0–36.0)
MCV: 92.4 fL (ref 80.0–100.0)
Platelets: 287 10*3/uL (ref 150–400)
RBC: 4.48 MIL/uL (ref 3.87–5.11)
RDW: 13.2 % (ref 11.5–15.5)
WBC: 6.7 10*3/uL (ref 4.0–10.5)
nRBC: 0 % (ref 0.0–0.2)

## 2024-01-06 LAB — HIV ANTIBODY (ROUTINE TESTING W REFLEX): HIV Screen 4th Generation wRfx: NONREACTIVE

## 2024-01-06 NOTE — Progress Notes (Signed)
 Subjective/Chief Complaint: Patient reports feeling better, less abdominal pain No nausea   Objective: Vital signs in last 24 hours: Temp:  [97.7 F (36.5 C)-98.4 F (36.9 C)] 98.3 F (36.8 C) (04/28 0537) Pulse Rate:  [64-82] 75 (04/28 0537) Resp:  [16-18] 18 (04/28 0537) BP: (118-183)/(63-96) 128/70 (04/28 0537) SpO2:  [94 %-100 %] 99 % (04/28 0537) Weight:  [83.1 kg] 83.1 kg (04/27 1711) Last BM Date : 01/05/24  Intake/Output from previous day: 04/27 0701 - 04/28 0700 In: 1415.2 [I.V.:1415.2] Out: 400 [Urine:400] Intake/Output this shift: No intake/output data recorded.  Exam: Awake and alert Looks comfortable Abdomen soft, minimally tender  Lab Results:  Recent Labs    01/05/24 1054 01/06/24 0413  WBC 8.6 6.7  HGB 14.1 13.2  HCT 41.4 41.4  PLT 316 287   BMET Recent Labs    01/05/24 1054 01/06/24 0413  NA 136 137  K 4.4 4.2  CL 102 105  CO2 23 24  GLUCOSE 129* 135*  BUN 20 19  CREATININE 1.20* 1.06*  CALCIUM 9.6 8.8*   PT/INR No results for input(s): "LABPROT", "INR" in the last 72 hours. ABG No results for input(s): "PHART", "HCO3" in the last 72 hours.  Invalid input(s): "PCO2", "PO2"  Studies/Results: DG Abd Portable 1V-Small Bowel Obstruction Protocol-initial, 8 hr delay Result Date: 01/06/2024 CLINICAL DATA:  Small bowel obstruction.  8 hour delay film. EXAM: PORTABLE ABDOMEN - 1 VIEW COMPARISON:  CT from 1 day prior. FINDINGS: Dense contrast material is seen in the lumen of the stomach and proximal small bowel. Diffuse diluted contrast material is seen throughout the small bowel and colon with contrast material visible in the sigmoid colon and also likely in the rectum. No substantial bowel dilatation. Dilated small bowel loop seen in the central abdomen and upper pelvis on yesterday's CT are not evident on this x-ray. IMPRESSION: Dense contrast material in the nondistended stomach and proximal jejunum with diffuse diluted contrast  material seen throughout nondilated small bowel and colon, to the level of the sigmoid segment and likely in the rectum. No small bowel or colonic dilatation evident. Electronically Signed   By: Donnal Fusi M.D.   On: 01/06/2024 08:29   CT ABDOMEN PELVIS W CONTRAST Result Date: 01/05/2024 CLINICAL DATA:  Evaluate bowel obstruction. History of small-bowel obstruction with recurrent symptoms. EXAM: CT ABDOMEN AND PELVIS WITH CONTRAST TECHNIQUE: Multidetector CT imaging of the abdomen and pelvis was performed using the standard protocol following bolus administration of intravenous contrast. RADIATION DOSE REDUCTION: This exam was performed according to the departmental dose-optimization program which includes automated exposure control, adjustment of the mA and/or kV according to patient size and/or use of iterative reconstruction technique. CONTRAST:  OMNIPAQUE  IOHEXOL  300 MG/ML  SOLN COMPARISON:  02/12/2020 FINDINGS: Lower chest: No acute abnormality. Hepatobiliary: No focal liver abnormality is seen. No gallstones, gallbladder wall thickening, or biliary dilatation. Pancreas: Small cyst within the liver are again seen the largest is along the dome measuring 1.5 cm, image 6/2. The gallbladder appears normal. No bile duct dilatation. Spleen: Normal in size without focal abnormality. Adrenals/Urinary Tract: Adrenal glands are unremarkable. Normal appearance of the left kidney. There is scarring and mild atrophy involving the left kidney. Urinary bladder demonstrates no significant findings. Stomach/Bowel: Small hiatal hernia. The proximal small bowel loops appear nondilated. The mid small bowel loops are dilated measuring up to 3 0.1 cm, image 50/2. Decreased caliber of the distal small bowel. Transition to decreased caliber distal small bowel is noted  within the ventral pelvis, image 64/7 and image 71/2. The appendix is visualized and appears normal. Normal appearance of the colon. Vascular/Lymphatic:  Normal appearance of the abdominal aorta. Upper abdominal vascularity appears patent. Reproductive: No abdominopelvic adenopathy. Other: Signs of previous ventral abdominal wall herniorrhaphy. No free fluid or fluid collections. No signs of pneumoperitoneum. Musculoskeletal: No acute or significant osseous findings. IMPRESSION: 1. Examination is positive for small bowel obstruction with transition point within the ventral pelvis. 2. Small hiatal hernia. 3. Scarring and mild atrophy involving the left kidney. Electronically Signed   By: Kimberley Penman M.D.   On: 01/05/2024 13:05    Anti-infectives: Anti-infectives (From admission, onward)    None       Assessment/Plan: SBO  -small bowel protocol films show contrast all the way to the sigmoid colon with no dilated small bowel  -will start clear liquid diet and advance as tolerated   Oza Blumenthal 01/06/2024

## 2024-01-06 NOTE — Progress Notes (Signed)
   01/06/24 0932  TOC Brief Assessment  Insurance and Status Reviewed  Patient has primary care physician Yes  Home environment has been reviewed resides in private residence  Prior level of function: Independent  Prior/Current Home Services No current home services  Social Drivers of Health Review SDOH reviewed no interventions necessary  Readmission risk has been reviewed Yes  Transition of care needs no transition of care needs at this time

## 2024-01-06 NOTE — Progress Notes (Signed)
 [facility] PROGRESS NOTE    Progress Note  Renee Murphy  ZOX:[id number] DOB: 1971/04/16 DOA: 01/05/2024 PCP: Amadeo June, MD     Brief Narrative:   Renee Murphy is an 53 y.o. female past medical history significant for multiple abdominal surgeries with bowel surgery and adhesions comes in with recurrent small bowel obstruction.  She relayed this started 2 days prior to admission.  CT scan of the abdomen pelvis was positive for small bowel obstruction with transition point in the ventral pelvis.   Assessment/Plan:   SBO (small bowel obstruction) (HCC) Started on the small bowel protocol. NG tube was not placed as the patient was refusing it and she was not vomiting. Started on pain medication IV fluids. General surgery was consulted who recommended conservative management.  AKI (acute kidney injury) (HCC) Baseline creatinine of less than 1, on admission 1.2. Started on conservative management with IV fluids and creatinine is returning to baseline.     DVT prophylaxis: lovenox  Family Communication:none Status is: Inpatient Remains inpatient appropriate because: Acute small bowel obstruction    Code Status:     Code Status Orders  (From admission, onward)           Start     Ordered   01/05/24 1712  Full code  Continuous       Question:  By:  Answer:  Consent: discussion documented in EHR   01/05/24 1711           Code Status History     Date Active Date Inactive Code Status Order ID Comments User Context   02/12/2020 2227 02/17/2020 1538 Full Code [id number]  Angelene Kelly, MD Inpatient   11/28/2015 1611 12/01/2015 1511 Full Code [id number]  Catrina Cocker Inpatient   03/26/2015 0107 03/29/2015 1057 Full Code [id number]  Caralyn Chandler, MD Inpatient         IV Access:   Peripheral IV   Procedures and diagnostic studies:   CT ABDOMEN PELVIS W CONTRAST Result Date: 01/05/2024 CLINICAL DATA:  Evaluate bowel  obstruction. History of small-bowel obstruction with recurrent symptoms. EXAM: CT ABDOMEN AND PELVIS WITH CONTRAST TECHNIQUE: Multidetector CT imaging of the abdomen and pelvis was performed using the standard protocol following bolus administration of intravenous contrast. RADIATION DOSE REDUCTION: This exam was performed according to the departmental dose-optimization program which includes automated exposure control, adjustment of the mA and/or kV according to patient size and/or use of iterative reconstruction technique. CONTRAST:  OMNIPAQUE  IOHEXOL  300 MG/ML  SOLN COMPARISON:  02/12/2020 FINDINGS: Lower chest: No acute abnormality. Hepatobiliary: No focal liver abnormality is seen. No gallstones, gallbladder wall thickening, or biliary dilatation. Pancreas: Small cyst within the liver are again seen the largest is along the dome measuring 1.5 cm, image 6/2. The gallbladder appears normal. No bile duct dilatation. Spleen: Normal in size without focal abnormality. Adrenals/Urinary Tract: Adrenal glands are unremarkable. Normal appearance of the left kidney. There is scarring and mild atrophy involving the left kidney. Urinary bladder demonstrates no significant findings. Stomach/Bowel: Small hiatal hernia. The proximal small bowel loops appear nondilated. The mid small bowel loops are dilated measuring up to 3 0.1 cm, image 50/2. Decreased caliber of the distal small bowel. Transition to decreased caliber distal small bowel is noted within the ventral pelvis, image 64/7 and image 71/2. The appendix is visualized and appears normal. Normal appearance of the colon. Vascular/Lymphatic: Normal appearance of the abdominal aorta. Upper abdominal vascularity appears patent. Reproductive: No abdominopelvic adenopathy. Other: Signs of previous  ventral abdominal wall herniorrhaphy. No free fluid or fluid collections. No signs of pneumoperitoneum. Musculoskeletal: No acute or significant osseous findings. IMPRESSION:  1. Examination is positive for small bowel obstruction with transition point within the ventral pelvis. 2. Small hiatal hernia. 3. Scarring and mild atrophy involving the left kidney. Electronically Signed   By: Kimberley Penman M.D.   On: 01/05/2024 13:05     Medical Consultants:   None.   Subjective:    Renee Murphy is not passing gas has not had a bowel movement abdominal pain is improved.  Objective:    Vitals:   01/05/24 1711 01/05/24 2210 01/06/24 0134 01/06/24 0537  BP:  136/63 (!) 140/68 128/70  Pulse:  75 74 75  Resp:  18 18 18   Temp:  97.8 F (36.6 C) 98.4 F (36.9 C) 98.3 F (36.8 C)  TempSrc:  Oral Oral Oral  SpO2:  100% 94% 99%  Weight: 83.1 kg     Height: 5\' 9"  (1.753 m)      SpO2: 99 %   Intake/Output Summary (Last 24 hours) at 01/06/2024 0629 Last data filed at 01/06/2024 0500 Gross per 24 hour  Intake 1415.22 ml  Output 400 ml  Net 1015.22 ml   Filed Weights   01/05/24 1711  Weight: 83.1 kg    Exam: General exam: In no acute distress. Respiratory system: Good air movement and clear to auscultation. Cardiovascular system: S1 & S2 heard, RRR. No JVD. Gastrointestinal system: Abdomen is distended, soft and nontender.  Extremities: No pedal edema. Skin: No rashes, lesions or ulcers Psychiatry: Judgement and insight appear normal. Mood & affect appropriate.    Data Reviewed:    Labs: Basic Metabolic Panel: Recent Labs  Lab 01/05/24 1054 01/06/24 0413  NA 136 137  K 4.4 4.2  CL 102 105  CO2 23 24  GLUCOSE 129* 135*  BUN 20 19  CREATININE 1.20* 1.06*  CALCIUM 9.6 8.8*  MG 2.3  --    GFR Estimated Creatinine Clearance: 72.4 mL/min (A) (by C-G formula based on SCr of 1.06 mg/dL (H)). Liver Function Tests: Recent Labs  Lab 01/05/24 1054  AST 21  ALT 13  ALKPHOS 83  BILITOT 0.3  PROT 8.0  ALBUMIN 4.7   Recent Labs  Lab 01/05/24 1054  LIPASE 14   No results for input(s): "AMMONIA" in the last 168 hours. Coagulation  profile No results for input(s): "INR", "PROTIME" in the last 168 hours. COVID-19 Labs  No results for input(s): "DDIMER", "FERRITIN", "LDH", "CRP" in the last 72 hours.  Lab Results  Component Value Date   SARSCOV2NAA NEGATIVE 02/12/2020    CBC: Recent Labs  Lab 01/05/24 1054 01/06/24 0413  WBC 8.6 6.7  NEUTROABS 6.8  --   HGB 14.1 13.2  HCT 41.4 41.4  MCV 87.5 92.4  PLT 316 287   Cardiac Enzymes: No results for input(s): "CKTOTAL", "CKMB", "CKMBINDEX", "TROPONINI" in the last 168 hours. BNP (last 3 results) No results for input(s): "PROBNP" in the last 8760 hours. CBG: No results for input(s): "GLUCAP" in the last 168 hours. D-Dimer: No results for input(s): "DDIMER" in the last 72 hours. Hgb A1c: No results for input(s): "HGBA1C" in the last 72 hours. Lipid Profile: No results for input(s): "CHOL", "HDL", "LDLCALC", "TRIG", "CHOLHDL", "LDLDIRECT" in the last 72 hours. Thyroid function studies: No results for input(s): "TSH", "T4TOTAL", "T3FREE", "THYROIDAB" in the last 72 hours.  Invalid input(s): "FREET3" Anemia work up: No results for input(s): "VITAMINB12", "FOLATE", "FERRITIN", "TIBC", "  IRON", "RETICCTPCT" in the last 72 hours. Sepsis Labs: Recent Labs  Lab 01/05/24 1054 01/06/24 0413  WBC 8.6 6.7   Microbiology No results found for this or any previous visit (from the past 240 hours).   Medications:    enoxaparin  (LOVENOX ) injection  40 mg Subcutaneous Q24H   Continuous Infusions:  sodium chloride  125 mL/hr at 01/05/24 1737      LOS: 1 day   Macdonald Savoy  [facility]  01/06/2024, 6:29 AM

## 2024-01-07 DIAGNOSIS — K56609 Unspecified intestinal obstruction, unspecified as to partial versus complete obstruction: Secondary | ICD-10-CM | POA: Diagnosis not present

## 2024-01-07 DIAGNOSIS — N179 Acute kidney failure, unspecified: Secondary | ICD-10-CM | POA: Diagnosis not present

## 2024-01-07 MED ORDER — SODIUM CHLORIDE 0.9 % IV SOLN: INTRAVENOUS | Status: AC

## 2024-01-07 NOTE — Progress Notes (Signed)
 Subjective: CC: Had some nausea after soft diet yesterday.  Doing well this am. No abdominal pain, n/v. Passing flatus. Liquid bm this am. 2-3 bm's over the last 24 hours.   Reports 5 sbo's since 2016 along w/ several episodes of n/v and abdominal pain that she manages by making herself npo at home.   Afebrile. No tachycardia or hypotension. WBC wnl on last check.   Objective: Vital signs in last 24 hours: Temp:  [97.9 F (36.6 C)-98.8 F (37.1 C)] 97.9 F (36.6 C) (04/29 0556) Pulse Rate:  [64-71] 64 (04/29 0556) Resp:  [16-18] 18 (04/29 0556) BP: (127-142)/(63-80) 127/63 (04/29 0556) SpO2:  [99 %-100 %] 100 % (04/29 0556) Last BM Date : 01/07/24  Intake/Output from previous day: 04/28 0701 - 04/29 0700 In: 600 [P.O.:600] Out: -  Intake/Output this shift: No intake/output data recorded.  PE: Abd: Soft, mild distension, mild epigastric and left hemiabdomen ttp without rigidity or guarding.   Lab Results:  Recent Labs    01/05/24 1054 01/06/24 0413  WBC 8.6 6.7  HGB 14.1 13.2  HCT 41.4 41.4  PLT 316 287   BMET Recent Labs    01/05/24 1054 01/06/24 0413  NA 136 137  K 4.4 4.2  CL 102 105  CO2 23 24  GLUCOSE 129* 135*  BUN 20 19  CREATININE 1.20* 1.06*  CALCIUM 9.6 8.8*   PT/INR No results for input(s): "LABPROT", "INR" in the last 72 hours. CMP     Component Value Date/Time   NA 137 01/06/2024 0413   K 4.2 01/06/2024 0413   CL 105 01/06/2024 0413   CO2 24 01/06/2024 0413   GLUCOSE 135 (H) 01/06/2024 0413   BUN 19 01/06/2024 0413   CREATININE 1.06 (H) 01/06/2024 0413   CALCIUM 8.8 (L) 01/06/2024 0413   PROT 8.0 01/05/2024 1054   ALBUMIN 4.7 01/05/2024 1054   AST 21 01/05/2024 1054   ALT 13 01/05/2024 1054   ALKPHOS 83 01/05/2024 1054   BILITOT 0.3 01/05/2024 1054   GFRNONAA >60 01/06/2024 0413   GFRAA >60 02/17/2020 0558   Lipase     Component Value Date/Time   LIPASE 14 01/05/2024 1054    Studies/Results: DG Abd Portable  1V-Small Bowel Obstruction Protocol-24 hr delay Result Date: 01/06/2024 EXAM: 1 VIEW XRAY OF THE ABDOMEN SUPINE 01/06/2024 10:19:00 PM COMPARISON: None available. CLINICAL HISTORY: Encounter for imaging study to confirm nasogastric (NG) tube placement (ICD (619) 527-6400). SBO Protocol, 24 hr delay image. FINDINGS: BOWEL: Contrast extends from the cecum to the rectum. Nonobstructive bowel gas pattern. PERITONEUM AND SOFT TISSUES: No abnormal calcifications. BONES: No acute osseous abnormality. IMPRESSION: 1. Contrast extends from the cecum to the rectum. Electronically signed by: Zadie Herter MD 01/06/2024 10:54 PM EDT RP Workstation: [id number]   DG Abd Portable 1V-Small Bowel Obstruction Protocol-initial, 8 hr delay Result Date: 01/06/2024 CLINICAL DATA:  Small bowel obstruction.  8 hour delay film. EXAM: PORTABLE ABDOMEN - 1 VIEW COMPARISON:  CT from 1 day prior. FINDINGS: Dense contrast material is seen in the lumen of the stomach and proximal small bowel. Diffuse diluted contrast material is seen throughout the small bowel and colon with contrast material visible in the sigmoid colon and also likely in the rectum. No substantial bowel dilatation. Dilated small bowel loop seen in the central abdomen and upper pelvis on yesterday's CT are not evident on this x-ray. IMPRESSION: Dense contrast material in the nondistended stomach and proximal jejunum with diffuse diluted contrast  material seen throughout nondilated small bowel and colon, to the level of the sigmoid segment and likely in the rectum. No small bowel or colonic dilatation evident. Electronically Signed   By: Donnal Fusi M.D.   On: 01/06/2024 08:29   CT ABDOMEN PELVIS W CONTRAST Result Date: 01/05/2024 CLINICAL DATA:  Evaluate bowel obstruction. History of small-bowel obstruction with recurrent symptoms. EXAM: CT ABDOMEN AND PELVIS WITH CONTRAST TECHNIQUE: Multidetector CT imaging of the abdomen and pelvis was performed using the standard protocol  following bolus administration of intravenous contrast. RADIATION DOSE REDUCTION: This exam was performed according to the departmental dose-optimization program which includes automated exposure control, adjustment of the mA and/or kV according to patient size and/or use of iterative reconstruction technique. CONTRAST:  OMNIPAQUE  IOHEXOL  300 MG/ML  SOLN COMPARISON:  02/12/2020 FINDINGS: Lower chest: No acute abnormality. Hepatobiliary: No focal liver abnormality is seen. No gallstones, gallbladder wall thickening, or biliary dilatation. Pancreas: Small cyst within the liver are again seen the largest is along the dome measuring 1.5 cm, image 6/2. The gallbladder appears normal. No bile duct dilatation. Spleen: Normal in size without focal abnormality. Adrenals/Urinary Tract: Adrenal glands are unremarkable. Normal appearance of the left kidney. There is scarring and mild atrophy involving the left kidney. Urinary bladder demonstrates no significant findings. Stomach/Bowel: Small hiatal hernia. The proximal small bowel loops appear nondilated. The mid small bowel loops are dilated measuring up to 3 0.1 cm, image 50/2. Decreased caliber of the distal small bowel. Transition to decreased caliber distal small bowel is noted within the ventral pelvis, image 64/7 and image 71/2. The appendix is visualized and appears normal. Normal appearance of the colon. Vascular/Lymphatic: Normal appearance of the abdominal aorta. Upper abdominal vascularity appears patent. Reproductive: No abdominopelvic adenopathy. Other: Signs of previous ventral abdominal wall herniorrhaphy. No free fluid or fluid collections. No signs of pneumoperitoneum. Musculoskeletal: No acute or significant osseous findings. IMPRESSION: 1. Examination is positive for small bowel obstruction with transition point within the ventral pelvis. 2. Small hiatal hernia. 3. Scarring and mild atrophy involving the left kidney. Electronically Signed   By: Kimberley Penman M.D.   On: 01/05/2024 13:05    Anti-infectives: Anti-infectives (From admission, onward)    None        Assessment/Plan SBO  - No indication for emergency surgery - Keep K >=4, Phos >= 3, Mg >= 2 and mobilize for bowel function - Appears to be radiographically and clinically resolving. Contrast in colon on xray and pt with return of bowel function. If tolerates soft diet for breakfast without n/v, she is okay for d/c from our standpoint.  - Will review w/ MD but given frequency of sbo's suspect she could benefit from seeing us  in the office to discuss elective diagnostic lap   FEN - Soft diet.  VTE - SCDs, Lovenox  ID - None  I reviewed nursing notes, hospitalist notes, last 24 h vitals and pain scores, last 48 h intake and output, last 24 h labs and trends, and last 24 h imaging results.    LOS: 2 days    Delton Filbert, [facility] Surgery 01/07/2024, 9:26 AM Please see Amion for pager number during day hours 7:00am-4:30pm

## 2024-01-07 NOTE — Plan of Care (Signed)

## 2024-01-07 NOTE — Progress Notes (Addendum)
 [facility] PROGRESS NOTE    Progress Note  Renee Murphy  WUJ:[id number] DOB: 29-May-1971 DOA: 01/05/2024 PCP: Amadeo June, MD     Brief Narrative:   Renee Murphy is an 53 y.o. female past medical history significant for multiple abdominal surgeries with bowel surgery and adhesions comes in with recurrent small bowel obstruction.  She relayed this started 2 days prior to admission.  CT scan of the abdomen pelvis with contrast was positive for small bowel obstruction with transition point in the ventral pelvis.   Assessment/Plan:   SBO (small bowel obstruction): Contrast in the colon. General Surgery recommended to advance to clear liquid diet. KVO IV fluids. General surgery was consulted who recommended conservative management. Further management per general surgery.  AKI (acute kidney injury) : Baseline creatinine of less than 1, on admission 1.2. Started on IV fluids creatinine is improving slowly.    DVT prophylaxis: lovenox  Family Communication:none Status is: Inpatient Remains inpatient appropriate because: Acute small bowel obstruction    Code Status:     Code Status Orders  (From admission, onward)           Start     Ordered   01/05/24 1712  Full code  Continuous       Question:  By:  Answer:  Consent: discussion documented in EHR   01/05/24 1711           Code Status History     Date Active Date Inactive Code Status Order ID Comments User Context   02/12/2020 2227 02/17/2020 1538 Full Code [id number]  Angelene Kelly, MD Inpatient   11/28/2015 1611 12/01/2015 1511 Full Code [id number]  Catrina Cocker Inpatient   03/26/2015 0107 03/29/2015 1057 Full Code [id number]  Lillette Reid III, MD Inpatient         IV Access:   Peripheral IV   Procedures and diagnostic studies:   DG Abd Portable 1V-Small Bowel Obstruction Protocol-24 hr delay Result Date: 01/06/2024 EXAM: 1 VIEW XRAY OF THE ABDOMEN SUPINE 01/06/2024  10:19:00 PM COMPARISON: None available. CLINICAL HISTORY: Encounter for imaging study to confirm nasogastric (NG) tube placement (ICD [phone]). SBO Protocol, 24 hr delay image. FINDINGS: BOWEL: Contrast extends from the cecum to the rectum. Nonobstructive bowel gas pattern. PERITONEUM AND SOFT TISSUES: No abnormal calcifications. BONES: No acute osseous abnormality. IMPRESSION: 1. Contrast extends from the cecum to the rectum. Electronically signed by: Zadie Herter MD 01/06/2024 10:54 PM EDT RP Workstation: [id number]   DG Abd Portable 1V-Small Bowel Obstruction Protocol-initial, 8 hr delay Result Date: 01/06/2024 CLINICAL DATA:  Small bowel obstruction.  8 hour delay film. EXAM: PORTABLE ABDOMEN - 1 VIEW COMPARISON:  CT from 1 day prior. FINDINGS: Dense contrast material is seen in the lumen of the stomach and proximal small bowel. Diffuse diluted contrast material is seen throughout the small bowel and colon with contrast material visible in the sigmoid colon and also likely in the rectum. No substantial bowel dilatation. Dilated small bowel loop seen in the central abdomen and upper pelvis on yesterday's CT are not evident on this x-ray. IMPRESSION: Dense contrast material in the nondistended stomach and proximal jejunum with diffuse diluted contrast material seen throughout nondilated small bowel and colon, to the level of the sigmoid segment and likely in the rectum. No small bowel or colonic dilatation evident. Electronically Signed   By: Donnal Fusi M.D.   On: 01/06/2024 08:29   CT ABDOMEN PELVIS W CONTRAST Result Date: 01/05/2024 CLINICAL DATA:  Evaluate bowel obstruction. History  of small-bowel obstruction with recurrent symptoms. EXAM: CT ABDOMEN AND PELVIS WITH CONTRAST TECHNIQUE: Multidetector CT imaging of the abdomen and pelvis was performed using the standard protocol following bolus administration of intravenous contrast. RADIATION DOSE REDUCTION: This exam was performed according to the  departmental dose-optimization program which includes automated exposure control, adjustment of the mA and/or kV according to patient size and/or use of iterative reconstruction technique. CONTRAST:  OMNIPAQUE  IOHEXOL  300 MG/ML  SOLN COMPARISON:  02/12/2020 FINDINGS: Lower chest: No acute abnormality. Hepatobiliary: No focal liver abnormality is seen. No gallstones, gallbladder wall thickening, or biliary dilatation. Pancreas: Small cyst within the liver are again seen the largest is along the dome measuring 1.5 cm, image 6/2. The gallbladder appears normal. No bile duct dilatation. Spleen: Normal in size without focal abnormality. Adrenals/Urinary Tract: Adrenal glands are unremarkable. Normal appearance of the left kidney. There is scarring and mild atrophy involving the left kidney. Urinary bladder demonstrates no significant findings. Stomach/Bowel: Small hiatal hernia. The proximal small bowel loops appear nondilated. The mid small bowel loops are dilated measuring up to 3 0.1 cm, image 50/2. Decreased caliber of the distal small bowel. Transition to decreased caliber distal small bowel is noted within the ventral pelvis, image 64/7 and image 71/2. The appendix is visualized and appears normal. Normal appearance of the colon. Vascular/Lymphatic: Normal appearance of the abdominal aorta. Upper abdominal vascularity appears patent. Reproductive: No abdominopelvic adenopathy. Other: Signs of previous ventral abdominal wall herniorrhaphy. No free fluid or fluid collections. No signs of pneumoperitoneum. Musculoskeletal: No acute or significant osseous findings. IMPRESSION: 1. Examination is positive for small bowel obstruction with transition point within the ventral pelvis. 2. Small hiatal hernia. 3. Scarring and mild atrophy involving the left kidney. Electronically Signed   By: Kimberley Penman M.D.   On: 01/05/2024 13:05     Medical Consultants:   None.   Subjective:    Renee Murphy past  has not had a bowel movement she was able to tolerate her diet.  Objective:    Vitals:   01/06/24 0537 01/06/24 1444 01/06/24 2155 01/07/24 0556  BP: 128/70 135/78 (!) 142/80 127/63  Pulse: 75 71 67 64  Resp: 18 16 18 18   Temp: 98.3 F (36.8 C) 98.8 F (37.1 C) 98.3 F (36.8 C) 97.9 F (36.6 C)  TempSrc: Oral Axillary Oral Oral  SpO2: 99% 100% 99% 100%  Weight:      Height:       SpO2: 100 %   Intake/Output Summary (Last 24 hours) at 01/07/2024 0642 Last data filed at 01/07/2024 0543 Gross per 24 hour  Intake 600 ml  Output --  Net 600 ml   Filed Weights   01/05/24 1711  Weight: 83.1 kg    Exam: General exam: In no acute distress. Respiratory system: Good air movement and clear to auscultation. Cardiovascular system: S1 & S2 heard, RRR. No JVD. Gastrointestinal system: Abdomen is less tender than yesterday, soft and nontender.  Extremities: No pedal edema. Skin: No rashes, lesions or ulcers Psychiatry: Judgement and insight appear normal. Mood & affect appropriate. Data Reviewed:    Labs: Basic Metabolic Panel: Recent Labs  Lab 01/05/24 1054 01/06/24 0413  NA 136 137  K 4.4 4.2  CL 102 105  CO2 23 24  GLUCOSE 129* 135*  BUN 20 19  CREATININE 1.20* 1.06*  CALCIUM 9.6 8.8*  MG 2.3  --    GFR Estimated Creatinine Clearance: 72.4 mL/min (A) (by C-G formula based on SCr of  1.06 mg/dL (H)). Liver Function Tests: Recent Labs  Lab 01/05/24 1054  AST 21  ALT 13  ALKPHOS 83  BILITOT 0.3  PROT 8.0  ALBUMIN 4.7   Recent Labs  Lab 01/05/24 1054  LIPASE 14   No results for input(s): "AMMONIA" in the last 168 hours. Coagulation profile No results for input(s): "INR", "PROTIME" in the last 168 hours. COVID-19 Labs  No results for input(s): "DDIMER", "FERRITIN", "LDH", "CRP" in the last 72 hours.  Lab Results  Component Value Date   SARSCOV2NAA NEGATIVE 02/12/2020    CBC: Recent Labs  Lab 01/05/24 1054 01/06/24 0413  WBC 8.6 6.7   NEUTROABS 6.8  --   HGB 14.1 13.2  HCT 41.4 41.4  MCV 87.5 92.4  PLT 316 287   Cardiac Enzymes: No results for input(s): "CKTOTAL", "CKMB", "CKMBINDEX", "TROPONINI" in the last 168 hours. BNP (last 3 results) No results for input(s): "PROBNP" in the last 8760 hours. CBG: No results for input(s): "GLUCAP" in the last 168 hours. D-Dimer: No results for input(s): "DDIMER" in the last 72 hours. Hgb A1c: No results for input(s): "HGBA1C" in the last 72 hours. Lipid Profile: No results for input(s): "CHOL", "HDL", "LDLCALC", "TRIG", "CHOLHDL", "LDLDIRECT" in the last 72 hours. Thyroid function studies: No results for input(s): "TSH", "T4TOTAL", "T3FREE", "THYROIDAB" in the last 72 hours.  Invalid input(s): "FREET3" Anemia work up: No results for input(s): "VITAMINB12", "FOLATE", "FERRITIN", "TIBC", "IRON", "RETICCTPCT" in the last 72 hours. Sepsis Labs: Recent Labs  Lab 01/05/24 1054 01/06/24 0413  WBC 8.6 6.7   Microbiology No results found for this or any previous visit (from the past 240 hours).   Medications:    enoxaparin  (LOVENOX ) injection  40 mg Subcutaneous Q24H   Continuous Infusions:      LOS: 2 days   Macdonald Savoy  [facility]  01/07/2024, 6:42 AM

## 2024-01-08 DIAGNOSIS — K56609 Unspecified intestinal obstruction, unspecified as to partial versus complete obstruction: Secondary | ICD-10-CM | POA: Diagnosis not present

## 2024-01-08 LAB — BASIC METABOLIC PANEL WITH GFR
Anion gap: 11 (ref 5–15)
BUN: 11 mg/dL (ref 6–20)
CO2: 22 mmol/L (ref 22–32)
Calcium: 8.7 mg/dL — ABNORMAL LOW (ref 8.9–10.3)
Chloride: 105 mmol/L (ref 98–111)
Creatinine, Ser: 0.93 mg/dL (ref 0.44–1.00)
GFR, Estimated: >60 mL/min (ref >60)
Glucose, Bld: 106 mg/dL — ABNORMAL HIGH (ref 70–99)
Potassium: 3.7 mmol/L (ref 3.5–5.1)
Sodium: 138 mmol/L (ref 135–145)

## 2024-01-08 NOTE — Discharge Summary (Signed)
 Physician Discharge Summary  Dae Urioste ZOX:[id number] DOB: 1971/08/05 DOA: 01/05/2024  PCP: Amadeo June, MD  Admit date: 01/05/2024 Discharge date: 01/08/2024  Admitted From: Home Disposition: Home  Recommendations for Outpatient Follow-up:  Follow up with PCP in 1-2 weeks Please obtain BMP/CBC in one week Schedule follow-up with surgery.  Discharge Condition: Stable CODE STATUS: Full code Diet recommendation: Regular diet, avoid constipation.  Discharge summary: 53 year old with history of previous multiple abdominal surgeries, adhesions who came to the emergency room with about 2 days of nausea abdominal pain and distention.  She frequently gets small bowel obstruction and treated conservatively.  She tried to stay on liquid diet but she continued to have vomiting so came to the emergency room.  In the emergency room hemodynamically stable.  Evidence of dehydration with AKI.  CT scan abdomen pelvis with contrast positive for small bowel obstruction with transition point in the ventral pelvis.  Partial small bowel obstruction: Admitted due to significant symptoms.  Initially n.p.o. and IV fluids.  Seen by surgery.  Quick resolution of symptoms.  Currently tolerating regular diet and having normal bowel function.  No pain.  Able to go home today.  Surgery advised outpatient follow-up to discuss about diagnostic lap. Advised to avoid constipation.  Soft diet.  AKI: Creatinine 1.2 on presentation.  Treated with fluids.  Improved and normalized.  Stable for discharge.   Discharge Diagnoses:  Principal Problem:   SBO (small bowel obstruction) (HCC) Active Problems:   AKI (acute kidney injury) [facility])    Discharge Instructions  Discharge Instructions     Diet general   Complete by: As directed    Soft diet   Increase activity slowly   Complete by: As directed       Allergies as of 01/08/2024       Reactions   Aspirin Nausea And Vomiting        Medication  List     STOP taking these medications    traMADol 50 MG tablet Commonly known as: ULTRAM       TAKE these medications    acetaminophen  500 MG tablet Commonly known as: TYLENOL  Take 1,000 mg by mouth every 6 (six) hours as needed for moderate pain (pain score 4-6).   cetirizine 10 MG tablet Commonly known as: ZYRTEC Take 10 mg by mouth daily.   FDGARD PO Take 1-2 capsules by mouth as needed.   MULTIVITAMIN ADULT PO Take 1 each by mouth daily.   Vitamin D  (Ergocalciferol ) 1.25 MG (50000 UNIT) Caps capsule Commonly known as: DRISDOL Take 50,000 Units by mouth once a week. On Sunday        Follow-up Information     Sur[facility], [facility] Follow up.   Specialty: General Surgery Contact information: [address] [place] [address] [phone]                Allergies  Allergen Reactions   Aspirin Nausea And Vomiting    Consultations: General Surgery   Procedures/Studies: DG Abd Portable 1V-Small Bowel Obstruction Protocol-24 hr delay Result Date: 01/06/2024 EXAM: 1 VIEW XRAY OF THE ABDOMEN SUPINE 01/06/2024 10:19:00 PM COMPARISON: None available. CLINICAL HISTORY: Encounter for imaging study to confirm nasogastric (NG) tube placement (ICD [phone]). SBO Protocol, 24 hr delay image. FINDINGS: BOWEL: Contrast extends from the cecum to the rectum. Nonobstructive bowel gas pattern. PERITONEUM AND SOFT TISSUES: No abnormal calcifications. BONES: No acute osseous abnormality. IMPRESSION: 1. Contrast extends from the cecum to the rectum. Electronically signed by:  Zadie Herter MD 01/06/2024 10:54 PM EDT RP Workstation: [id number]   DG Abd Portable 1V-Small Bowel Obstruction Protocol-initial, 8 hr delay Result Date: 01/06/2024 CLINICAL DATA:  Small bowel obstruction.  8 hour delay film. EXAM: PORTABLE ABDOMEN - 1 VIEW COMPARISON:  CT from 1 day prior. FINDINGS: Dense contrast material is seen in the lumen of the stomach and proximal small bowel.  Diffuse diluted contrast material is seen throughout the small bowel and colon with contrast material visible in the sigmoid colon and also likely in the rectum. No substantial bowel dilatation. Dilated small bowel loop seen in the central abdomen and upper pelvis on yesterday's CT are not evident on this x-ray. IMPRESSION: Dense contrast material in the nondistended stomach and proximal jejunum with diffuse diluted contrast material seen throughout nondilated small bowel and colon, to the level of the sigmoid segment and likely in the rectum. No small bowel or colonic dilatation evident. Electronically Signed   By: Donnal Fusi M.D.   On: 01/06/2024 08:29   CT ABDOMEN PELVIS W CONTRAST Result Date: 01/05/2024 CLINICAL DATA:  Evaluate bowel obstruction. History of small-bowel obstruction with recurrent symptoms. EXAM: CT ABDOMEN AND PELVIS WITH CONTRAST TECHNIQUE: Multidetector CT imaging of the abdomen and pelvis was performed using the standard protocol following bolus administration of intravenous contrast. RADIATION DOSE REDUCTION: This exam was performed according to the departmental dose-optimization program which includes automated exposure control, adjustment of the mA and/or kV according to patient size and/or use of iterative reconstruction technique. CONTRAST:  OMNIPAQUE  IOHEXOL  300 MG/ML  SOLN COMPARISON:  02/12/2020 FINDINGS: Lower chest: No acute abnormality. Hepatobiliary: No focal liver abnormality is seen. No gallstones, gallbladder wall thickening, or biliary dilatation. Pancreas: Small cyst within the liver are again seen the largest is along the dome measuring 1.5 cm, image 6/2. The gallbladder appears normal. No bile duct dilatation. Spleen: Normal in size without focal abnormality. Adrenals/Urinary Tract: Adrenal glands are unremarkable. Normal appearance of the left kidney. There is scarring and mild atrophy involving the left kidney. Urinary bladder demonstrates no significant  findings. Stomach/Bowel: Small hiatal hernia. The proximal small bowel loops appear nondilated. The mid small bowel loops are dilated measuring up to 3 0.1 cm, image 50/2. Decreased caliber of the distal small bowel. Transition to decreased caliber distal small bowel is noted within the ventral pelvis, image 64/7 and image 71/2. The appendix is visualized and appears normal. Normal appearance of the colon. Vascular/Lymphatic: Normal appearance of the abdominal aorta. Upper abdominal vascularity appears patent. Reproductive: No abdominopelvic adenopathy. Other: Signs of previous ventral abdominal wall herniorrhaphy. No free fluid or fluid collections. No signs of pneumoperitoneum. Musculoskeletal: No acute or significant osseous findings. IMPRESSION: 1. Examination is positive for small bowel obstruction with transition point within the ventral pelvis. 2. Small hiatal hernia. 3. Scarring and mild atrophy involving the left kidney. Electronically Signed   By: Kimberley Penman M.D.   On: 01/05/2024 13:05   (Echo, Carotid, EGD, Colonoscopy, ERCP)    Subjective: Patient seen and examined. Tolerating regular diet.  Passing flatus and bowel movements.  Without symptoms today.  Eager to go home.   Discharge Exam: Vitals:   01/07/24 2103 01/08/24 0530  BP: 129/78 129/77  Pulse: 72 61  Resp: 16 16  Temp: 97.8 F (36.6 C) 97.8 F (36.6 C)  SpO2: 100% 100%   Vitals:   01/07/24 0556 01/07/24 1335 01/07/24 2103 01/08/24 0530  BP: 127/63 (!) 119/91 129/78 129/77  Pulse: 64 78 72 61  Resp:  18 16 16 16   Temp: 97.9 F (36.6 C) 99 F (37.2 C) 97.8 F (36.6 C) 97.8 F (36.6 C)  TempSrc: Oral Oral Oral Oral  SpO2: 100% 100% 100% 100%  Weight:      Height:        General: Pt is alert, awake, not in acute distress Cardiovascular: RRR, S1/S2 +, no rubs, no gallops Respiratory: CTA bilaterally, no wheezing, no rhonchi Abdominal: Soft, NT, ND, bowel sounds + Extremities: no edema, no cyanosis    The  results of significant diagnostics from this hospitalization (including imaging, microbiology, ancillary and laboratory) are listed below for reference.     Microbiology: No results found for this or any previous visit (from the past 240 hours).   Labs: BNP (last 3 results) No results for input(s): "BNP" in the last 8760 hours. Basic Metabolic Panel: Recent Labs  Lab 01/05/24 1054 01/06/24 0413 01/08/24 0506  NA 136 137 138  K 4.4 4.2 3.7  CL 102 105 105  CO2 23 24 22   GLUCOSE 129* 135* 106*  BUN 20 19 11   CREATININE 1.20* 1.06* 0.93  CALCIUM 9.6 8.8* 8.7*  MG 2.3  --   --    Liver Function Tests: Recent Labs  Lab 01/05/24 1054  AST 21  ALT 13  ALKPHOS 83  BILITOT 0.3  PROT 8.0  ALBUMIN 4.7   Recent Labs  Lab 01/05/24 1054  LIPASE 14   No results for input(s): "AMMONIA" in the last 168 hours. CBC: Recent Labs  Lab 01/05/24 1054 01/06/24 0413  WBC 8.6 6.7  NEUTROABS 6.8  --   HGB 14.1 13.2  HCT 41.4 41.4  MCV 87.5 92.4  PLT 316 287   Cardiac Enzymes: No results for input(s): "CKTOTAL", "CKMB", "CKMBINDEX", "TROPONINI" in the last 168 hours. BNP: Invalid input(s): "POCBNP" CBG: No results for input(s): "GLUCAP" in the last 168 hours. D-Dimer No results for input(s): "DDIMER" in the last 72 hours. Hgb A1c No results for input(s): "HGBA1C" in the last 72 hours. Lipid Profile No results for input(s): "CHOL", "HDL", "LDLCALC", "TRIG", "CHOLHDL", "LDLDIRECT" in the last 72 hours. Thyroid function studies No results for input(s): "TSH", "T4TOTAL", "T3FREE", "THYROIDAB" in the last 72 hours.  Invalid input(s): "FREET3" Anemia work up No results for input(s): "VITAMINB12", "FOLATE", "FERRITIN", "TIBC", "IRON", "RETICCTPCT" in the last 72 hours. Urinalysis    Component Value Date/Time   COLORURINE YELLOW 01/05/2024 1025   APPEARANCEUR CLEAR 01/05/2024 1025   LABSPEC 1.015 01/05/2024 1025   PHURINE 7.0 01/05/2024 1025   GLUCOSEU NEGATIVE 01/05/2024  1025   HGBUR TRACE (A) 01/05/2024 1025   BILIRUBINUR NEGATIVE 01/05/2024 1025   BILIRUBINUR negative 01/18/2020 1122   KETONESUR NEGATIVE 01/05/2024 1025   PROTEINUR NEGATIVE 01/05/2024 1025   UROBILINOGEN 0.2 01/18/2020 1122   UROBILINOGEN 0.2 03/25/2015 1925   NITRITE NEGATIVE 01/05/2024 1025   LEUKOCYTESUR NEGATIVE 01/05/2024 1025   Sepsis Labs Recent Labs  Lab 01/05/24 1054 01/06/24 0413  WBC 8.6 6.7   Microbiology No results found for this or any previous visit (from the past 240 hours).   Time coordinating discharge:  35 minutes  SIGNED:   Vada Garibaldi, MD  [facility] 01/08/2024, 9:40 AM

## 2024-01-08 NOTE — Plan of Care (Signed)

## 2024-01-08 NOTE — Progress Notes (Signed)
 Patient ID: Renee Murphy, female   DOB: 1971-07-08, 53 y.o.   MRN: [id number]   Tolerating po Denies abdominal pain Kept overnight secondary to elevated creatinine Now normal  Ok for discharge from a surgical standpoint
